# Patient Record
Sex: Male | Born: 1950 | Race: White | Hispanic: No | Marital: Married | Smoking: Never smoker
Health system: Southern US, Community
[De-identification: ages and names within clinical notes are randomized; demographics above are authoritative.]

## PROBLEM LIST (undated history)

## (undated) DIAGNOSIS — Z85828 Personal history of other malignant neoplasm of skin: Secondary | ICD-10-CM

## (undated) DIAGNOSIS — R7303 Prediabetes: Secondary | ICD-10-CM

## (undated) DIAGNOSIS — E785 Hyperlipidemia, unspecified: Secondary | ICD-10-CM

## (undated) DIAGNOSIS — K635 Polyp of colon: Secondary | ICD-10-CM

## (undated) DIAGNOSIS — H269 Unspecified cataract: Secondary | ICD-10-CM

## (undated) HISTORY — DX: Unspecified cataract: H26.9

## (undated) HISTORY — DX: Personal history of other malignant neoplasm of skin: Z85.828

## (undated) HISTORY — PX: COLONOSCOPY: SHX174

## (undated) HISTORY — DX: Polyp of colon: K63.5

## (undated) HISTORY — PX: APPENDECTOMY: SHX54

## (undated) HISTORY — DX: Hyperlipidemia, unspecified: E78.5

## (undated) HISTORY — PX: COSMETIC SURGERY: SHX468

## (undated) HISTORY — DX: Prediabetes: R73.03

---
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
## 2004-01-24 ENCOUNTER — Ambulatory Visit (HOSPITAL_COMMUNITY): Admission: RE | Admit: 2004-01-24 | Discharge: 2004-01-24 | Payer: Self-pay | Admitting: Neurology

## 2010-12-26 HISTORY — PX: OTHER SURGICAL HISTORY: SHX169

## 2015-02-13 ENCOUNTER — Institutional Professional Consult (permissible substitution): Payer: Self-pay | Admitting: Internal Medicine

## 2015-02-25 ENCOUNTER — Emergency Department (HOSPITAL_BASED_OUTPATIENT_CLINIC_OR_DEPARTMENT_OTHER)
Admission: EM | Admit: 2015-02-25 | Discharge: 2015-02-25 | Disposition: A | Discharge status: Home / Self Care | Attending: Emergency Medicine | Admitting: Emergency Medicine

## 2015-02-25 ENCOUNTER — Emergency Department (HOSPITAL_BASED_OUTPATIENT_CLINIC_OR_DEPARTMENT_OTHER)

## 2015-02-25 ENCOUNTER — Encounter (HOSPITAL_BASED_OUTPATIENT_CLINIC_OR_DEPARTMENT_OTHER): Payer: Self-pay | Admitting: Emergency Medicine

## 2015-02-25 DIAGNOSIS — X58XXXA Exposure to other specified factors, initial encounter: Secondary | ICD-10-CM | POA: Insufficient documentation

## 2015-02-25 DIAGNOSIS — R52 Pain, unspecified: Secondary | ICD-10-CM

## 2015-02-25 DIAGNOSIS — Y9389 Activity, other specified: Secondary | ICD-10-CM | POA: Diagnosis not present

## 2015-02-25 DIAGNOSIS — Z880 Allergy status to penicillin: Secondary | ICD-10-CM | POA: Diagnosis not present

## 2015-02-25 DIAGNOSIS — Y998 Other external cause status: Secondary | ICD-10-CM | POA: Insufficient documentation

## 2015-02-25 DIAGNOSIS — S63501A Unspecified sprain of right wrist, initial encounter: Secondary | ICD-10-CM

## 2015-02-25 DIAGNOSIS — Y9289 Other specified places as the place of occurrence of the external cause: Secondary | ICD-10-CM | POA: Diagnosis not present

## 2015-02-25 DIAGNOSIS — S6991XA Unspecified injury of right wrist, hand and finger(s), initial encounter: Secondary | ICD-10-CM | POA: Diagnosis present

## 2015-02-25 MED ORDER — NAPROXEN 250 MG PO TABS
500.0000 mg | ORAL_TABLET | Freq: Once | ORAL | Status: AC
  Administered 2015-02-25: 500 mg via ORAL
  Filled 2015-02-25: qty 2

## 2015-02-25 MED ORDER — MELOXICAM 15 MG PO TABS: 15.0000 mg | ORAL_TABLET | Freq: Every day | ORAL | Status: DC

## 2015-02-25 MED ORDER — TRAMADOL HCL 50 MG PO TABS: 50.0000 mg | ORAL_TABLET | Freq: Four times a day (QID) | ORAL | Status: DC | PRN

## 2015-02-25 NOTE — ED Provider Notes (Signed)
CSN: [id number]     Arrival date & time 02/25/15  1952 History   First MD Initiated Contact with Patient 02/25/15 2117     Chief Complaint  Patient presents with  . Wrist Pain     (Consider location/radiation/quality/duration/timing/severity/associated sxs/prior Treatment) HPI Victor Zamora is a 64 y.o. male presents to emergency department after right wrist injury. Patient states he was lifting a heavy bag of dirt with one hand when his hand bent backwards at the wrist joint. Says the pain to the anterior wrist. Tender to palpation and painful with movement. Nothing makes it better. He did not take any medications. States initially after the injuries hand felt tingly and numb, however that resolved. He denies any prior wrist or hand issues. Pain radiates to the elbow and the hand. Pain is rated as 6 out of 10  History reviewed. No pertinent past medical history. History reviewed. No pertinent past surgical history. History reviewed. No pertinent family history. History  Substance Use Topics  . Smoking status: Never Smoker   . Smokeless tobacco: Never Used  . Alcohol Use: Yes    Review of Systems  Constitutional: Negative for fever and chills.  Musculoskeletal: Positive for joint swelling and arthralgias.  Neurological: Negative for weakness and numbness.      Allergies  Penicillins  Home Medications   Prior to Admission medications   Not on File   BP 126/89 mmHg  Pulse 105  Temp(Src) 97.9 F (36.6 C) (Oral)  Resp 20  Ht 5\' 11"  (1.803 m)  Wt 133 lb (60.328 kg)  BMI 18.56 kg/m2  SpO2 100% Physical Exam  Constitutional: He appears well-developed and well-nourished.  HENT:  Head: Normocephalic.  Eyes: Conjunctivae are normal.  Neck: Neck supple.  Cardiovascular: Normal rate and regular rhythm.   Pulmonary/Chest: Effort normal and breath sounds normal. No respiratory distress. He has no wheezes. He has no rales.  Musculoskeletal:  No obvious swelling noted to the  right wrist. Tender to palpation over the flexor tendons of the wrist. Pain with any flexion or extension of the wrist joint. Normal hand. Pain with flexion of all the fingers against resistance. Patient is able to oppose his thumb to ring finger. Able to do thumbs up. Able to spread the fingers. Normal elbow exam. Strength is intact. Cap refill less than 2 seconds distally. Sensation is intact of her palmar and dorsal surface of the hand.  Skin: Skin is warm and dry.  Nursing note and vitals reviewed.   ED Course  Procedures (including critical care time) Labs Review Labs Reviewed - No data to display  Imaging Review Dg Wrist Complete Right  02/25/2015   CLINICAL DATA:  Right wrist pain, onset after lifting heavy object.  EXAM: RIGHT WRIST - COMPLETE 3+ VIEW  COMPARISON:  None.  FINDINGS: There is no evidence of fracture or dislocation. There is no evidence of arthropathy or other focal bone abnormality. Soft tissues are unremarkable.  IMPRESSION: Negative.   Electronically Signed   By: Andreas Newport M.D.   On: 02/25/2015 21:50     EKG Interpretation None      MDM   Final diagnoses:  Wrist sprain, right, initial encounter    patient with wrist injury, x-ray negative. Most likely tendon flexor strain. Doubt any tear, strength is intact. We'll place a Velcro wrist splint. Home with NSAIDs and follow-up with hand specialist.  Filed Vitals:   02/25/15 2037 02/25/15 2245  BP: 126/89 132/81  Pulse: 105 86  Temp: 97.9 F (36.6 C)   TempSrc: Oral   Resp: 20 18  Height: 5\' 11"  (1.803 m)   Weight: 133 lb (60.328 kg)   SpO2: 100% 99%     Jeannett Senior, PA-C 02/25/15 [place], MD 02/26/15 [id number]

## 2015-02-25 NOTE — ED Notes (Addendum)
Pt report onset of right wrist pain after lifting heavy bag of dirt. Pain increases with movement

## 2015-02-25 NOTE — Discharge Instructions (Signed)
Low back and Ultram for pain. Keep the splint on at all times. Ice, elevate. No lifting greater than 1 lb until cleared by hand specialist.   Wrist Pain Wrist injuries are frequent in adults and children. A sprain is an injury to the ligaments that hold your bones together. A strain is an injury to muscle or muscle cord-like structures (tendons) from stretching or pulling. Generally, when wrists are moderately tender to touch following a fall or injury, a break in the bone (fracture) may be present. Most wrist sprains or strains are better in 3 to 5 days, but complete healing may take several weeks. HOME CARE INSTRUCTIONS   Put ice on the injured area.  Put ice in a plastic bag.  Place a towel between your skin and the bag.  Leave the ice on for 15-20 minutes, 3-4 times a day, for the first 2 days, or as directed by your health care provider.  Keep your arm raised above the level of your heart whenever possible to reduce swelling and pain.  Rest the injured area for at least 48 hours or as directed by your health care provider.  If a splint or elastic bandage has been applied, use it for as long as directed by your health care provider or until seen by a health care provider for a follow-up exam.  Only take over-the-counter or prescription medicines for pain, discomfort, or fever as directed by your health care provider.  Keep all follow-up appointments. You may need to follow up with a specialist or have follow-up X-rays. Improvement in pain level is not a guarantee that you did not fracture a bone in your wrist. The only way to determine whether or not you have a broken bone is by X-ray. SEEK IMMEDIATE MEDICAL CARE IF:   Your fingers are swollen, very red, white, or cold and blue.  Your fingers are numb or tingling.  You have increasing pain.  You have difficulty moving your fingers. MAKE SURE YOU:   Understand these instructions.  Will watch your condition.  Will get help  right away if you are not doing well or get worse. Document Released: 08/21/2005 Document Revised: 11/16/2013 Document Reviewed: 01/02/2011 [facility] Patient Information 2015 [place], [place]. This information is not intended to replace advice given to you by your health care provider. Make sure you discuss any questions you have with your health care provider.

## 2016-09-09 IMAGING — CR DG WRIST COMPLETE 3+V*R*
4 series · 4 of 4 positions shown · non-contrast
Comparison: None.

CLINICAL DATA: Right wrist pain, onset after lifting heavy object.

EXAM:
RIGHT WRIST - COMPLETE 3+ VIEW

[x navicular]
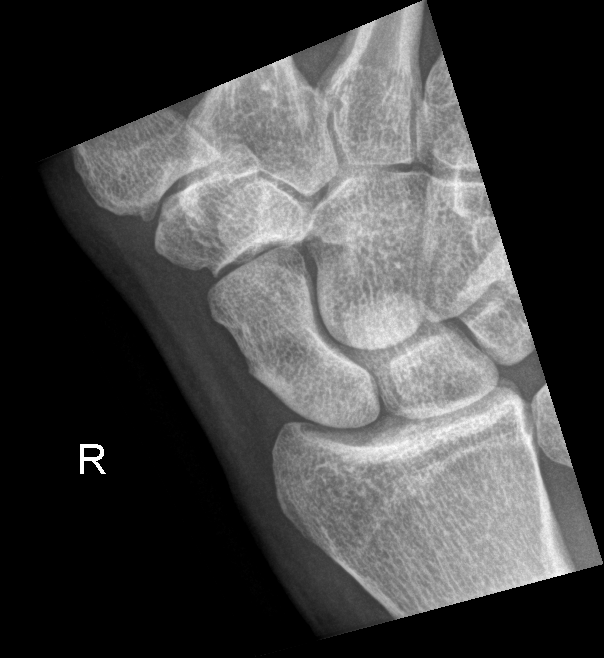

[x wrist pa left]
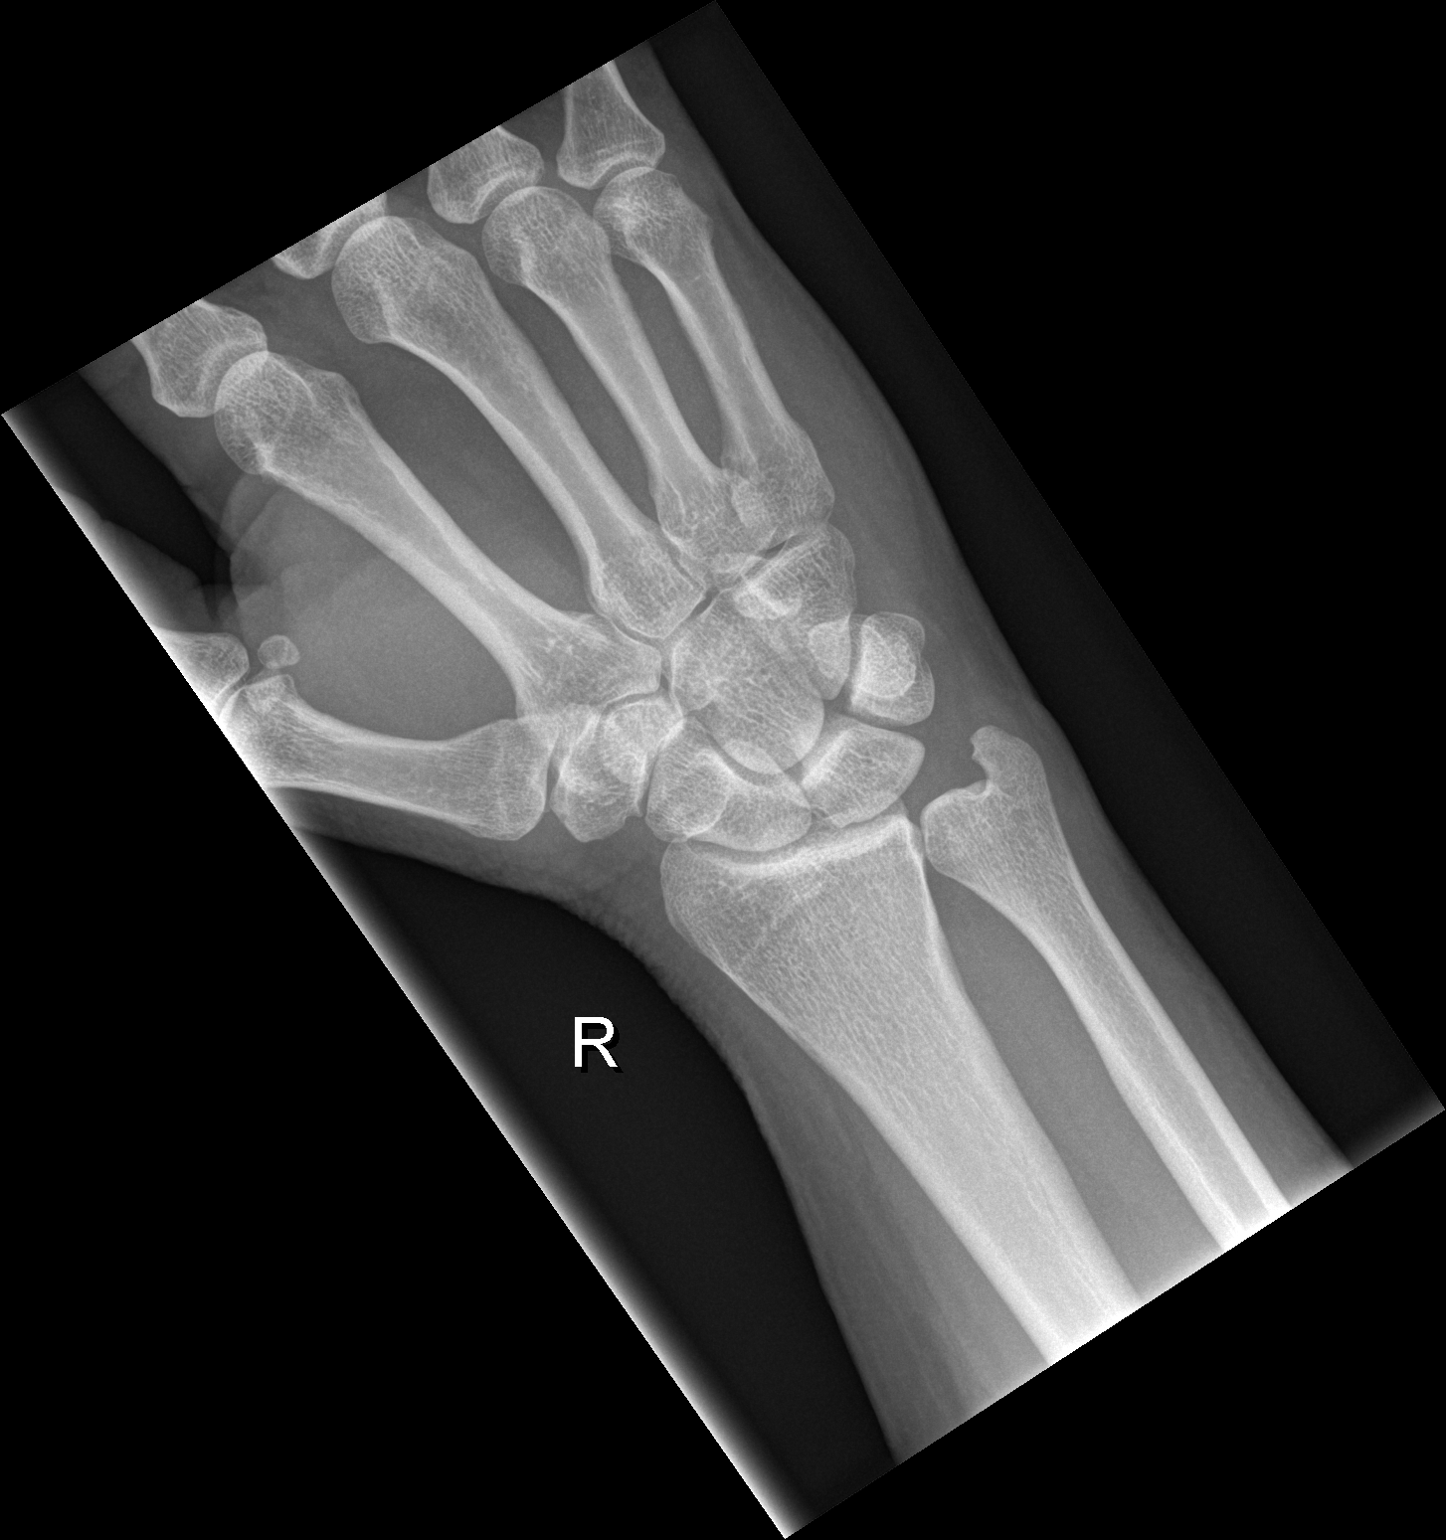

[x wrist obl left]
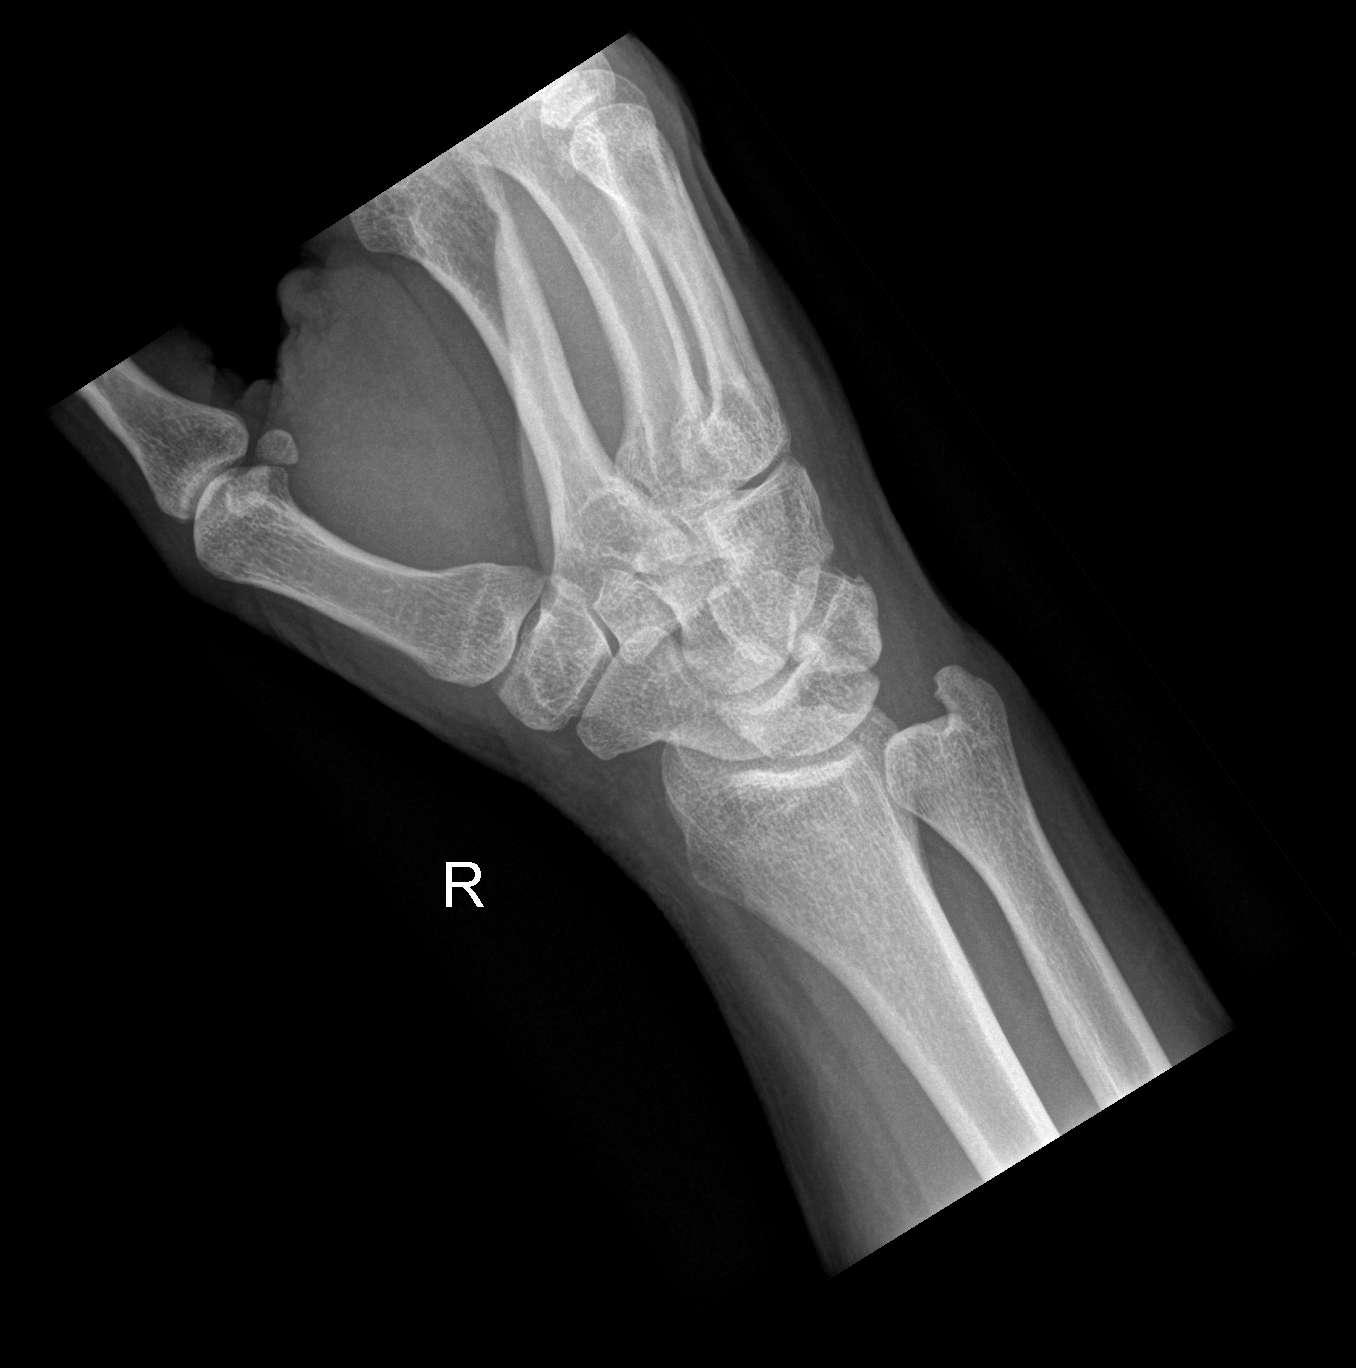

[x wrist lat left]
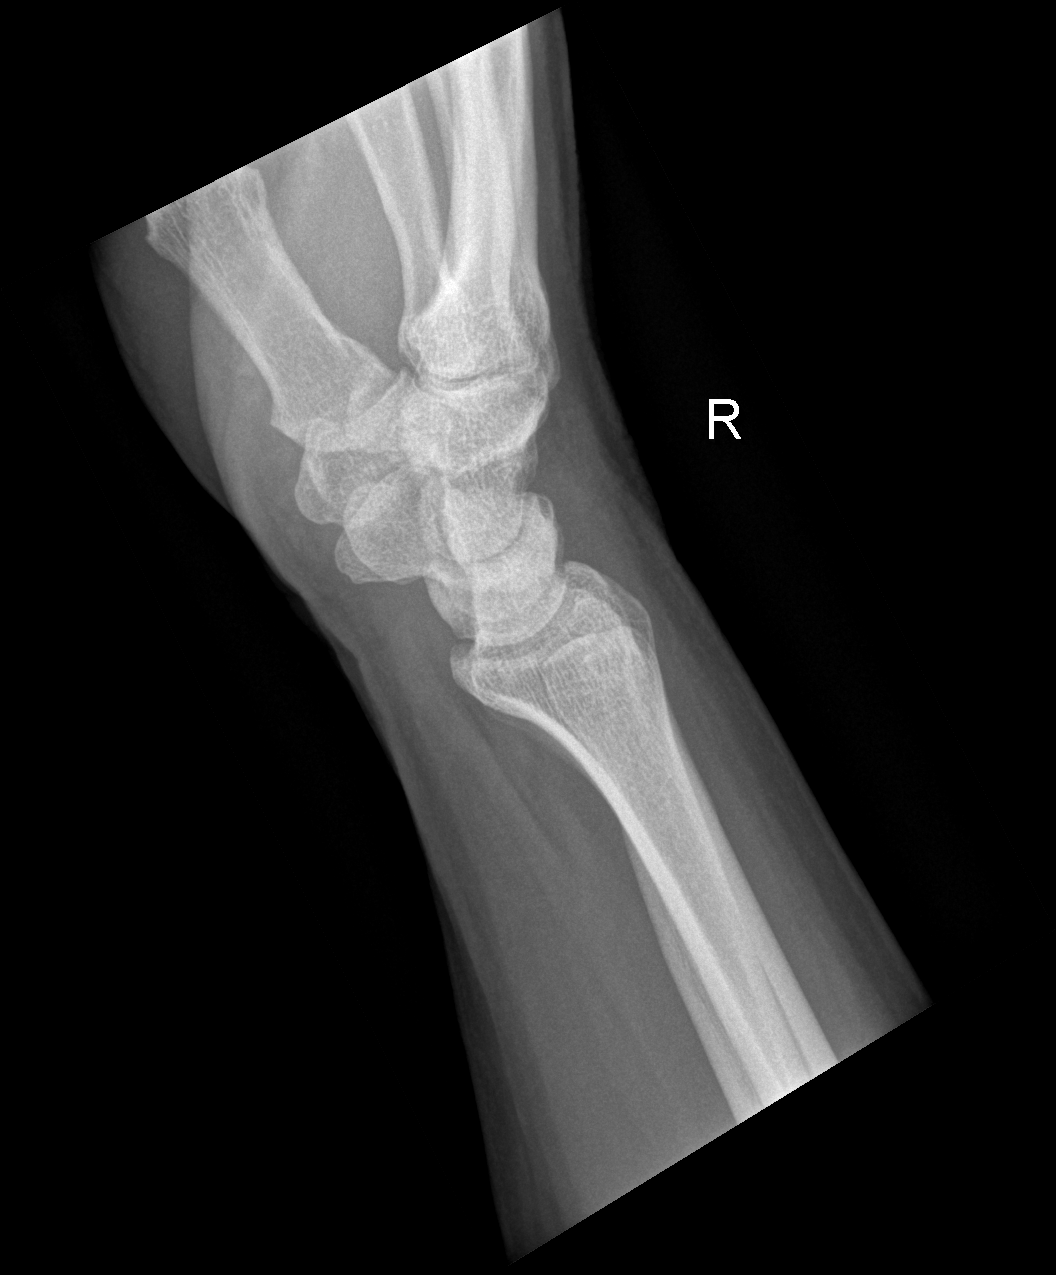

[4 of 4 positions shown; findings below may reference images not displayed]

FINDINGS: There is no evidence of fracture or dislocation. There is no
evidence of arthropathy or other focal bone abnormality. Soft
tissues are unremarkable.
IMPRESSION: Negative.

## 2017-03-26 ENCOUNTER — Encounter: Payer: Self-pay | Admitting: Podiatry

## 2017-03-26 ENCOUNTER — Ambulatory Visit (INDEPENDENT_AMBULATORY_CARE_PROVIDER_SITE_OTHER): Admitting: Podiatry

## 2017-03-26 VITALS — BP 136/88 | HR 77 | Ht 71.0 in | Wt 230.0 lb

## 2017-03-26 DIAGNOSIS — M79672 Pain in left foot: Secondary | ICD-10-CM | POA: Diagnosis not present

## 2017-03-26 DIAGNOSIS — M216X2 Other acquired deformities of left foot: Secondary | ICD-10-CM

## 2017-03-26 DIAGNOSIS — M21962 Unspecified acquired deformity of left lower leg: Secondary | ICD-10-CM

## 2017-03-26 DIAGNOSIS — G5762 Lesion of plantar nerve, left lower limb: Secondary | ICD-10-CM | POA: Diagnosis not present

## 2017-03-26 NOTE — Patient Instructions (Signed)
Seen for pain under the ball of left foot. Noted of compensatory hyperpronation of foot and ankle in high arch foot. Reviewed findings and available treatment options. Metatarsal binder x 1 pair dispensed. Placed in Ankle brace. Return for follow up. May start on custom orthotics.

## 2017-03-26 NOTE — Progress Notes (Signed)
SUBJECTIVE: 66 y.o. year old male presents complaining of pain under the ball of left foot for 4-5 months. Been treated with injection, change shoes, OTC orthotics. Last injection helped for a day. 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 At times goes out to walk. Been in [place] that required to be on feet long hours. Always had high arches. Patient was referred by Dr. Leonette Nutting.   HPI: Pain started from arch and heel pain 4-5 months ago and now pain is at the ball of left foot. Been to [facility] and was told high arch is causing problem. Patient was treated conservatively by Dr. Gershon Mussel prior to this visit.   REVIEW OF SYSTEMS: A comprehensive review of systems was negative except for: bursitis in left hip.  OBJECTIVE: DERMATOLOGIC EXAMINATION: No abnormal skin lesions noted.  VASCULAR EXAMINATION OF LOWER LIMBS: All pedal pulses are palpable with normal pulsation.  Capillary Filling times within 3 seconds in all digits.  No edema or erythema noted. Temperature gradient from tibial crest to dorsum of foot is within normal bilateral.  NEUROLOGIC EXAMINATION OF THE LOWER LIMBS: All epicritic and tactile sensations grossly intact. Sharp and Dull discriminatory sensations at the plantar ball of hallux is intact bilateral.   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 Compensatory ankle joint pronation L>R. Increased sagittal plane motion of the first ray left. Pain with weight bearing under the 2nd intermetatarsal space left foot duration of 4-5 months.  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 Compensated rearfoot varus left. Excess first ray motion with abnormal lateral weight shifting left.  PLAN: Reviewed findings and available treatment options, injection, orthotics, proper shoe gear, and surgical. Metatarsal binder dispensed to add stability of the first ray left. Ankle brace  dispensed to limit ankle pronation left. Patient is to bring his orthotics for evaluation. Return in 2 weeks for possible custom orthotics and possible pre op discussion if ankle brace fail to alleviate left foot pain.

## 2017-04-10 ENCOUNTER — Ambulatory Visit: Admitting: Podiatry

## 2017-04-14 ENCOUNTER — Ambulatory Visit (INDEPENDENT_AMBULATORY_CARE_PROVIDER_SITE_OTHER): Admitting: Podiatry

## 2017-04-14 DIAGNOSIS — G5762 Lesion of plantar nerve, left lower limb: Secondary | ICD-10-CM

## 2017-04-14 DIAGNOSIS — M21962 Unspecified acquired deformity of left lower leg: Secondary | ICD-10-CM

## 2017-04-14 DIAGNOSIS — M216X2 Other acquired deformities of left foot: Secondary | ICD-10-CM

## 2017-04-14 NOTE — Progress Notes (Signed)
SUBJECTIVE: 66 y.o. year old male presents stating that the foot is doing better except with brace. Brace is making the foot hurt. But the band is helping to walker better. Pain used to be over level 10. Now the pain level is in between 3-4.  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 At times goes out to walk. Been in [place] that required to be on feet long hours. Always had high arches. Patient was referred by Dr. Leonette Nutting.   HPI: Pain started from arch and heel pain 4-5 months ago and now pain is at the ball of left foot. Been to [facility] and was told high arch is causing problem. Patient was treated conservatively by Dr. Gershon Mussel prior to this visit.   REVIEW OF SYSTEMS: A comprehensive review of systems was negative except for: bursitis in left hip.  OBJECTIVE: DERMATOLOGIC EXAMINATION: No abnormal skin lesions noted.  VASCULAR EXAMINATION OF LOWER LIMBS: All pedal pulses are palpable with normal pulsation.  Capillary Filling times within 3 seconds in all digits.  No edema or erythema noted. Temperature gradient from tibial crest to dorsum of foot is within normal bilateral.  NEUROLOGIC EXAMINATION OF THE LOWER LIMBS: All epicritic and tactile sensations grossly intact. Sharp and Dull discriminatory sensations at the plantar ball of hallux is intact bilateral.   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 Compensatory ankle joint pronation L>R. Increased sagittal plane motion of the first ray left. Pain with weight bearing under the 2nd intermetatarsal space left foot duration of 4-5 months.  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 Compensated rearfoot varus left. Excess first ray motion with abnormal lateral weight shifting left.  PLAN: Reviewed findings and available treatment options, injection, orthotics, proper shoe gear, and surgical. Continue with metatarsal  binder as needed to add stability of the first ray left. Reviewed benefit of custom orthotics. Return as needed.

## 2017-04-14 NOTE — Patient Instructions (Signed)
Improving pain under the ball of left foot. Continue with metatarsal binder and padding. May benefit from custom orthotics. Return as needed.

## 2017-04-15 ENCOUNTER — Encounter: Payer: Self-pay | Admitting: Podiatry

## 2017-11-25 HISTORY — PX: OTHER SURGICAL HISTORY: SHX169

## 2017-12-19 DIAGNOSIS — L570 Actinic keratosis: Secondary | ICD-10-CM | POA: Diagnosis not present

## 2018-01-14 DIAGNOSIS — D485 Neoplasm of uncertain behavior of skin: Secondary | ICD-10-CM | POA: Diagnosis not present

## 2018-01-14 DIAGNOSIS — L578 Other skin changes due to chronic exposure to nonionizing radiation: Secondary | ICD-10-CM | POA: Diagnosis not present

## 2018-01-14 DIAGNOSIS — C4442 Squamous cell carcinoma of skin of scalp and neck: Secondary | ICD-10-CM | POA: Diagnosis not present

## 2018-01-14 DIAGNOSIS — L723 Sebaceous cyst: Secondary | ICD-10-CM | POA: Diagnosis not present

## 2018-01-15 DIAGNOSIS — L570 Actinic keratosis: Secondary | ICD-10-CM | POA: Diagnosis not present

## 2018-01-26 DIAGNOSIS — L905 Scar conditions and fibrosis of skin: Secondary | ICD-10-CM | POA: Diagnosis not present

## 2018-01-26 DIAGNOSIS — C4442 Squamous cell carcinoma of skin of scalp and neck: Secondary | ICD-10-CM | POA: Diagnosis not present

## 2018-02-16 DIAGNOSIS — Z136 Encounter for screening for cardiovascular disorders: Secondary | ICD-10-CM | POA: Diagnosis not present

## 2018-02-16 DIAGNOSIS — R7301 Impaired fasting glucose: Secondary | ICD-10-CM | POA: Diagnosis not present

## 2018-02-16 DIAGNOSIS — Z135 Encounter for screening for eye and ear disorders: Secondary | ICD-10-CM | POA: Diagnosis not present

## 2018-03-02 DIAGNOSIS — L570 Actinic keratosis: Secondary | ICD-10-CM | POA: Diagnosis not present

## 2018-04-23 DIAGNOSIS — L570 Actinic keratosis: Secondary | ICD-10-CM | POA: Diagnosis not present

## 2018-05-26 DIAGNOSIS — L720 Epidermal cyst: Secondary | ICD-10-CM | POA: Diagnosis not present

## 2018-05-26 DIAGNOSIS — L723 Sebaceous cyst: Secondary | ICD-10-CM | POA: Diagnosis not present

## 2018-05-26 DIAGNOSIS — L821 Other seborrheic keratosis: Secondary | ICD-10-CM | POA: Diagnosis not present

## 2018-06-05 DIAGNOSIS — H35363 Drusen (degenerative) of macula, bilateral: Secondary | ICD-10-CM | POA: Diagnosis not present

## 2018-06-05 DIAGNOSIS — H35033 Hypertensive retinopathy, bilateral: Secondary | ICD-10-CM | POA: Diagnosis not present

## 2018-06-05 DIAGNOSIS — H40013 Open angle with borderline findings, low risk, bilateral: Secondary | ICD-10-CM | POA: Diagnosis not present

## 2018-06-05 DIAGNOSIS — H43812 Vitreous degeneration, left eye: Secondary | ICD-10-CM | POA: Diagnosis not present

## 2018-07-28 DIAGNOSIS — H02413 Mechanical ptosis of bilateral eyelids: Secondary | ICD-10-CM | POA: Diagnosis not present

## 2018-08-17 DIAGNOSIS — H02412 Mechanical ptosis of left eyelid: Secondary | ICD-10-CM | POA: Diagnosis not present

## 2018-08-17 DIAGNOSIS — H02413 Mechanical ptosis of bilateral eyelids: Secondary | ICD-10-CM | POA: Diagnosis not present

## 2018-08-17 DIAGNOSIS — H02411 Mechanical ptosis of right eyelid: Secondary | ICD-10-CM | POA: Diagnosis not present

## 2018-09-24 DIAGNOSIS — L570 Actinic keratosis: Secondary | ICD-10-CM | POA: Diagnosis not present

## 2018-11-04 DIAGNOSIS — M7541 Impingement syndrome of right shoulder: Secondary | ICD-10-CM | POA: Diagnosis not present

## 2018-11-04 DIAGNOSIS — M25511 Pain in right shoulder: Secondary | ICD-10-CM | POA: Diagnosis not present

## 2018-11-27 DIAGNOSIS — H02833 Dermatochalasis of right eye, unspecified eyelid: Secondary | ICD-10-CM | POA: Diagnosis not present

## 2018-11-27 DIAGNOSIS — H40013 Open angle with borderline findings, low risk, bilateral: Secondary | ICD-10-CM | POA: Diagnosis not present

## 2018-11-27 DIAGNOSIS — H02403 Unspecified ptosis of bilateral eyelids: Secondary | ICD-10-CM | POA: Diagnosis not present

## 2019-02-12 DIAGNOSIS — L570 Actinic keratosis: Secondary | ICD-10-CM | POA: Diagnosis not present

## 2019-02-12 DIAGNOSIS — Z85828 Personal history of other malignant neoplasm of skin: Secondary | ICD-10-CM | POA: Diagnosis not present

## 2019-02-12 DIAGNOSIS — L819 Disorder of pigmentation, unspecified: Secondary | ICD-10-CM | POA: Diagnosis not present

## 2019-02-12 DIAGNOSIS — D485 Neoplasm of uncertain behavior of skin: Secondary | ICD-10-CM | POA: Diagnosis not present

## 2019-02-12 DIAGNOSIS — L820 Inflamed seborrheic keratosis: Secondary | ICD-10-CM | POA: Diagnosis not present

## 2019-05-03 DIAGNOSIS — Z03818 Encounter for observation for suspected exposure to other biological agents ruled out: Secondary | ICD-10-CM | POA: Diagnosis not present

## 2019-06-07 DIAGNOSIS — H40013 Open angle with borderline findings, low risk, bilateral: Secondary | ICD-10-CM | POA: Diagnosis not present

## 2019-06-07 DIAGNOSIS — Z961 Presence of intraocular lens: Secondary | ICD-10-CM | POA: Diagnosis not present

## 2019-06-07 DIAGNOSIS — H35033 Hypertensive retinopathy, bilateral: Secondary | ICD-10-CM | POA: Diagnosis not present

## 2019-06-07 DIAGNOSIS — H43812 Vitreous degeneration, left eye: Secondary | ICD-10-CM | POA: Diagnosis not present

## 2019-06-22 DIAGNOSIS — C44529 Squamous cell carcinoma of skin of other part of trunk: Secondary | ICD-10-CM | POA: Diagnosis not present

## 2019-06-22 DIAGNOSIS — D485 Neoplasm of uncertain behavior of skin: Secondary | ICD-10-CM | POA: Diagnosis not present

## 2019-07-05 DIAGNOSIS — L988 Other specified disorders of the skin and subcutaneous tissue: Secondary | ICD-10-CM | POA: Diagnosis not present

## 2019-07-05 DIAGNOSIS — Z85828 Personal history of other malignant neoplasm of skin: Secondary | ICD-10-CM | POA: Diagnosis not present

## 2019-07-05 DIAGNOSIS — C44529 Squamous cell carcinoma of skin of other part of trunk: Secondary | ICD-10-CM | POA: Diagnosis not present

## 2019-08-05 DIAGNOSIS — L570 Actinic keratosis: Secondary | ICD-10-CM | POA: Diagnosis not present

## 2019-08-05 DIAGNOSIS — D485 Neoplasm of uncertain behavior of skin: Secondary | ICD-10-CM | POA: Diagnosis not present

## 2019-08-05 DIAGNOSIS — Z85828 Personal history of other malignant neoplasm of skin: Secondary | ICD-10-CM | POA: Diagnosis not present

## 2019-12-29 DIAGNOSIS — R69 Illness, unspecified: Secondary | ICD-10-CM | POA: Diagnosis not present

## 2019-12-30 DIAGNOSIS — N5201 Erectile dysfunction due to arterial insufficiency: Secondary | ICD-10-CM | POA: Diagnosis not present

## 2019-12-30 DIAGNOSIS — E349 Endocrine disorder, unspecified: Secondary | ICD-10-CM | POA: Diagnosis not present

## 2020-02-23 DIAGNOSIS — N5201 Erectile dysfunction due to arterial insufficiency: Secondary | ICD-10-CM | POA: Diagnosis not present

## 2020-03-24 DIAGNOSIS — H43811 Vitreous degeneration, right eye: Secondary | ICD-10-CM | POA: Diagnosis not present

## 2020-03-24 DIAGNOSIS — H43393 Other vitreous opacities, bilateral: Secondary | ICD-10-CM | POA: Diagnosis not present

## 2020-05-10 DIAGNOSIS — D2361 Other benign neoplasm of skin of right upper limb, including shoulder: Secondary | ICD-10-CM | POA: Diagnosis not present

## 2020-05-10 DIAGNOSIS — D1801 Hemangioma of skin and subcutaneous tissue: Secondary | ICD-10-CM | POA: Diagnosis not present

## 2020-05-10 DIAGNOSIS — L820 Inflamed seborrheic keratosis: Secondary | ICD-10-CM | POA: Diagnosis not present

## 2020-05-10 DIAGNOSIS — L570 Actinic keratosis: Secondary | ICD-10-CM | POA: Diagnosis not present

## 2020-05-10 DIAGNOSIS — Z85828 Personal history of other malignant neoplasm of skin: Secondary | ICD-10-CM | POA: Diagnosis not present

## 2020-05-10 DIAGNOSIS — D0439 Carcinoma in situ of skin of other parts of face: Secondary | ICD-10-CM | POA: Diagnosis not present

## 2020-05-10 DIAGNOSIS — L821 Other seborrheic keratosis: Secondary | ICD-10-CM | POA: Diagnosis not present

## 2020-05-10 DIAGNOSIS — D485 Neoplasm of uncertain behavior of skin: Secondary | ICD-10-CM | POA: Diagnosis not present

## 2020-06-28 DIAGNOSIS — R69 Illness, unspecified: Secondary | ICD-10-CM | POA: Diagnosis not present

## 2020-07-03 DIAGNOSIS — Z1152 Encounter for screening for COVID-19: Secondary | ICD-10-CM | POA: Diagnosis not present

## 2020-07-03 DIAGNOSIS — Z20828 Contact with and (suspected) exposure to other viral communicable diseases: Secondary | ICD-10-CM | POA: Diagnosis not present

## 2020-07-20 DIAGNOSIS — Z85828 Personal history of other malignant neoplasm of skin: Secondary | ICD-10-CM | POA: Diagnosis not present

## 2020-07-20 DIAGNOSIS — M71341 Other bursal cyst, right hand: Secondary | ICD-10-CM | POA: Diagnosis not present

## 2020-07-20 DIAGNOSIS — D485 Neoplasm of uncertain behavior of skin: Secondary | ICD-10-CM | POA: Diagnosis not present

## 2020-08-02 DIAGNOSIS — N529 Male erectile dysfunction, unspecified: Secondary | ICD-10-CM | POA: Diagnosis not present

## 2020-08-02 DIAGNOSIS — E785 Hyperlipidemia, unspecified: Secondary | ICD-10-CM | POA: Diagnosis not present

## 2020-08-02 DIAGNOSIS — Z0000 Encounter for general adult medical examination without abnormal findings: Secondary | ICD-10-CM | POA: Diagnosis not present

## 2020-08-02 DIAGNOSIS — R739 Hyperglycemia, unspecified: Secondary | ICD-10-CM | POA: Diagnosis not present

## 2020-08-02 DIAGNOSIS — Z125 Encounter for screening for malignant neoplasm of prostate: Secondary | ICD-10-CM | POA: Diagnosis not present

## 2020-08-21 DIAGNOSIS — R69 Illness, unspecified: Secondary | ICD-10-CM | POA: Diagnosis not present

## 2021-02-26 DIAGNOSIS — H524 Presbyopia: Secondary | ICD-10-CM | POA: Diagnosis not present

## 2021-02-26 DIAGNOSIS — H43812 Vitreous degeneration, left eye: Secondary | ICD-10-CM | POA: Diagnosis not present

## 2021-02-26 DIAGNOSIS — H43393 Other vitreous opacities, bilateral: Secondary | ICD-10-CM | POA: Diagnosis not present

## 2021-02-26 DIAGNOSIS — H43811 Vitreous degeneration, right eye: Secondary | ICD-10-CM | POA: Diagnosis not present

## 2021-02-26 DIAGNOSIS — H35363 Drusen (degenerative) of macula, bilateral: Secondary | ICD-10-CM | POA: Diagnosis not present

## 2021-04-10 DIAGNOSIS — L820 Inflamed seborrheic keratosis: Secondary | ICD-10-CM | POA: Diagnosis not present

## 2021-04-10 DIAGNOSIS — Z85828 Personal history of other malignant neoplasm of skin: Secondary | ICD-10-CM | POA: Diagnosis not present

## 2021-04-10 DIAGNOSIS — L821 Other seborrheic keratosis: Secondary | ICD-10-CM | POA: Diagnosis not present

## 2021-04-10 DIAGNOSIS — L720 Epidermal cyst: Secondary | ICD-10-CM | POA: Diagnosis not present

## 2021-04-10 DIAGNOSIS — D485 Neoplasm of uncertain behavior of skin: Secondary | ICD-10-CM | POA: Diagnosis not present

## 2021-04-10 DIAGNOSIS — L814 Other melanin hyperpigmentation: Secondary | ICD-10-CM | POA: Diagnosis not present

## 2021-04-10 DIAGNOSIS — D225 Melanocytic nevi of trunk: Secondary | ICD-10-CM | POA: Diagnosis not present

## 2021-04-10 DIAGNOSIS — L570 Actinic keratosis: Secondary | ICD-10-CM | POA: Diagnosis not present

## 2021-05-14 DIAGNOSIS — L988 Other specified disorders of the skin and subcutaneous tissue: Secondary | ICD-10-CM | POA: Diagnosis not present

## 2021-05-14 DIAGNOSIS — D485 Neoplasm of uncertain behavior of skin: Secondary | ICD-10-CM | POA: Diagnosis not present

## 2021-08-23 DIAGNOSIS — R066 Hiccough: Secondary | ICD-10-CM | POA: Diagnosis not present

## 2021-08-28 DIAGNOSIS — M546 Pain in thoracic spine: Secondary | ICD-10-CM | POA: Diagnosis not present

## 2021-09-02 DIAGNOSIS — Z20822 Contact with and (suspected) exposure to covid-19: Secondary | ICD-10-CM | POA: Diagnosis not present

## 2021-09-17 DIAGNOSIS — J209 Acute bronchitis, unspecified: Secondary | ICD-10-CM | POA: Diagnosis not present

## 2021-09-17 DIAGNOSIS — U099 Post covid-19 condition, unspecified: Secondary | ICD-10-CM | POA: Diagnosis not present

## 2021-09-17 DIAGNOSIS — R053 Chronic cough: Secondary | ICD-10-CM | POA: Diagnosis not present

## 2021-10-08 DIAGNOSIS — L570 Actinic keratosis: Secondary | ICD-10-CM | POA: Diagnosis not present

## 2021-10-08 DIAGNOSIS — D485 Neoplasm of uncertain behavior of skin: Secondary | ICD-10-CM | POA: Diagnosis not present

## 2021-10-08 DIAGNOSIS — L821 Other seborrheic keratosis: Secondary | ICD-10-CM | POA: Diagnosis not present

## 2021-10-08 DIAGNOSIS — D0439 Carcinoma in situ of skin of other parts of face: Secondary | ICD-10-CM | POA: Diagnosis not present

## 2021-12-28 DIAGNOSIS — R7301 Impaired fasting glucose: Secondary | ICD-10-CM | POA: Diagnosis not present

## 2021-12-28 DIAGNOSIS — Z125 Encounter for screening for malignant neoplasm of prostate: Secondary | ICD-10-CM | POA: Diagnosis not present

## 2021-12-28 DIAGNOSIS — N529 Male erectile dysfunction, unspecified: Secondary | ICD-10-CM | POA: Diagnosis not present

## 2021-12-28 DIAGNOSIS — E785 Hyperlipidemia, unspecified: Secondary | ICD-10-CM | POA: Diagnosis not present

## 2022-01-03 DIAGNOSIS — E785 Hyperlipidemia, unspecified: Secondary | ICD-10-CM | POA: Diagnosis not present

## 2022-01-03 DIAGNOSIS — Z0000 Encounter for general adult medical examination without abnormal findings: Secondary | ICD-10-CM | POA: Diagnosis not present

## 2022-01-03 DIAGNOSIS — N529 Male erectile dysfunction, unspecified: Secondary | ICD-10-CM | POA: Diagnosis not present

## 2022-01-03 DIAGNOSIS — R7303 Prediabetes: Secondary | ICD-10-CM | POA: Diagnosis not present

## 2022-02-13 DIAGNOSIS — D045 Carcinoma in situ of skin of trunk: Secondary | ICD-10-CM | POA: Diagnosis not present

## 2022-02-13 DIAGNOSIS — D485 Neoplasm of uncertain behavior of skin: Secondary | ICD-10-CM | POA: Diagnosis not present

## 2022-02-13 DIAGNOSIS — Z1211 Encounter for screening for malignant neoplasm of colon: Secondary | ICD-10-CM | POA: Diagnosis not present

## 2022-02-13 DIAGNOSIS — L820 Inflamed seborrheic keratosis: Secondary | ICD-10-CM | POA: Diagnosis not present

## 2022-02-28 DIAGNOSIS — H43813 Vitreous degeneration, bilateral: Secondary | ICD-10-CM | POA: Diagnosis not present

## 2022-02-28 DIAGNOSIS — H40013 Open angle with borderline findings, low risk, bilateral: Secondary | ICD-10-CM | POA: Diagnosis not present

## 2022-02-28 DIAGNOSIS — Z961 Presence of intraocular lens: Secondary | ICD-10-CM | POA: Diagnosis not present

## 2022-02-28 DIAGNOSIS — H353131 Nonexudative age-related macular degeneration, bilateral, early dry stage: Secondary | ICD-10-CM | POA: Diagnosis not present

## 2022-03-02 DIAGNOSIS — Z809 Family history of malignant neoplasm, unspecified: Secondary | ICD-10-CM | POA: Diagnosis not present

## 2022-03-02 DIAGNOSIS — Z6831 Body mass index (BMI) 31.0-31.9, adult: Secondary | ICD-10-CM | POA: Diagnosis not present

## 2022-03-02 DIAGNOSIS — N529 Male erectile dysfunction, unspecified: Secondary | ICD-10-CM | POA: Diagnosis not present

## 2022-03-02 DIAGNOSIS — E669 Obesity, unspecified: Secondary | ICD-10-CM | POA: Diagnosis not present

## 2022-03-02 DIAGNOSIS — E785 Hyperlipidemia, unspecified: Secondary | ICD-10-CM | POA: Diagnosis not present

## 2022-03-02 DIAGNOSIS — Z7722 Contact with and (suspected) exposure to environmental tobacco smoke (acute) (chronic): Secondary | ICD-10-CM | POA: Diagnosis not present

## 2022-03-02 DIAGNOSIS — Z7982 Long term (current) use of aspirin: Secondary | ICD-10-CM | POA: Diagnosis not present

## 2022-03-02 DIAGNOSIS — Z85828 Personal history of other malignant neoplasm of skin: Secondary | ICD-10-CM | POA: Diagnosis not present

## 2022-03-02 DIAGNOSIS — Z880 Allergy status to penicillin: Secondary | ICD-10-CM | POA: Diagnosis not present

## 2022-05-15 DIAGNOSIS — L570 Actinic keratosis: Secondary | ICD-10-CM | POA: Diagnosis not present

## 2022-05-15 DIAGNOSIS — Z85828 Personal history of other malignant neoplasm of skin: Secondary | ICD-10-CM | POA: Diagnosis not present

## 2022-05-15 DIAGNOSIS — L821 Other seborrheic keratosis: Secondary | ICD-10-CM | POA: Diagnosis not present

## 2022-05-15 DIAGNOSIS — D485 Neoplasm of uncertain behavior of skin: Secondary | ICD-10-CM | POA: Diagnosis not present

## 2022-05-15 DIAGNOSIS — C44519 Basal cell carcinoma of skin of other part of trunk: Secondary | ICD-10-CM | POA: Diagnosis not present

## 2022-05-15 DIAGNOSIS — D2361 Other benign neoplasm of skin of right upper limb, including shoulder: Secondary | ICD-10-CM | POA: Diagnosis not present

## 2022-07-09 DIAGNOSIS — R059 Cough, unspecified: Secondary | ICD-10-CM | POA: Diagnosis not present

## 2022-07-09 DIAGNOSIS — B349 Viral infection, unspecified: Secondary | ICD-10-CM | POA: Diagnosis not present

## 2022-07-09 DIAGNOSIS — J029 Acute pharyngitis, unspecified: Secondary | ICD-10-CM | POA: Diagnosis not present

## 2022-07-09 DIAGNOSIS — U071 COVID-19: Secondary | ICD-10-CM | POA: Diagnosis not present

## 2022-07-09 DIAGNOSIS — R0982 Postnasal drip: Secondary | ICD-10-CM | POA: Diagnosis not present

## 2022-07-11 DIAGNOSIS — Z20822 Contact with and (suspected) exposure to covid-19: Secondary | ICD-10-CM | POA: Diagnosis not present

## 2022-07-12 DIAGNOSIS — Z20822 Contact with and (suspected) exposure to covid-19: Secondary | ICD-10-CM | POA: Diagnosis not present

## 2022-07-15 DIAGNOSIS — Z20822 Contact with and (suspected) exposure to covid-19: Secondary | ICD-10-CM | POA: Diagnosis not present

## 2022-07-16 DIAGNOSIS — Z20822 Contact with and (suspected) exposure to covid-19: Secondary | ICD-10-CM | POA: Diagnosis not present

## 2022-07-18 ENCOUNTER — Ambulatory Visit (INDEPENDENT_AMBULATORY_CARE_PROVIDER_SITE_OTHER): Payer: Medicare HMO | Admitting: Adult Health

## 2022-07-18 ENCOUNTER — Encounter: Payer: Self-pay | Admitting: Adult Health

## 2022-07-18 VITALS — BP 120/80 | HR 76 | Temp 98.8°F | Ht 71.0 in | Wt 237.0 lb

## 2022-07-18 DIAGNOSIS — E782 Mixed hyperlipidemia: Secondary | ICD-10-CM

## 2022-07-18 DIAGNOSIS — R7303 Prediabetes: Secondary | ICD-10-CM | POA: Diagnosis not present

## 2022-07-18 DIAGNOSIS — Z7689 Persons encountering health services in other specified circumstances: Secondary | ICD-10-CM

## 2022-07-18 NOTE — Patient Instructions (Addendum)
It was great seeing you today   I will see you for your physical exam after February 3rd   If you need anything in the meantime, please let me know

## 2022-07-18 NOTE — Progress Notes (Signed)
Patient presents to clinic today to establish care.  He is transferring from [facility]. His last CPE was in February 2023  Acute Concerns: Establish Care  Chronic Issues: Pre diabetes - last A1c in Feb 2023 was 5.8   Hyperlipidemia - prescribed Crestor 10 mg daily. He denies myalgia or fatigue. He has been on a statin about two years.   History of Skin Cancer - has had a few MOHS procedures, history of BCC and SCC. He is seen at [facility] routinely.  Health Maintenance: Dental -- Routine Care  Vision -- Routine Care Immunizations -- UTD  Colonoscopy -- 2014 - has had a history of polyps.  Diet: Tries to eat healthy, very little fast food and red meats.  Exercise: Does yard work but does not do a lot of exercise routinely.   Past Medical History:  Diagnosis Date   Colon polyps    History of skin cancer    Hyperlipidemia    Pre-diabetes     Past Surgical History:  Procedure Laterality Date   appendectomy  1969   Cataract Bilateral 12/2010   eye lid surgery   2019    Current Outpatient Medications on File Prior to Visit  Medication Sig Dispense Refill   aspirin EC (ASPIR-LOW) 81 MG tablet 1 tablet     Bacillus Coagulans-Inulin (PROBIOTIC) 1-250 BILLION-MG CAPS      Biotin 10000 MCG TABS 1 tablet     cholecalciferol (VITAMIN D3) 25 MCG (1000 UNIT) tablet 1 tablet     cyanocobalamin (VITAMIN B12) 100 MCG tablet See admin instructions.     Magnesium 125 MG CAPS Take 125 mg by mouth. Twice a week     rosuvastatin (CRESTOR) 10 MG tablet Take 10 mg by mouth at bedtime.     tadalafil (CIALIS) 5 MG tablet 1 tablet as needed     No current facility-administered medications on file prior to visit.    Allergies  Allergen Reactions   Clarithromycin Other (See Comments)   Penicillins     Family History  Problem Relation Age of Onset   Cancer Mother    Pancreatic cancer Father     Social History   Socioeconomic History   Marital status:  Married    Spouse name: Not on file   Number of children: Not on file   Years of education: Not on file   Highest education level: Not on file  Occupational History   Not on file  Tobacco Use   Smoking status: Never   Smokeless tobacco: Never  Vaping Use   Vaping Use: Never used  Substance and Sexual Activity   Alcohol use: Yes    Alcohol/week: 7.0 standard drinks of alcohol    Types: 2 Glasses of wine, 3 Cans of beer, 2 Shots of liquor per week   Drug use: No   Sexual activity: Never  Other Topics Concern   Not on file  Social History Narrative   Not on file   Social Determinants of Health   Financial Resource Strain: Not on file  Food Insecurity: Not on file  Transportation Needs: Not on file  Physical Activity: Not on file  Stress: Not on file  Social Connections: Not on file  Intimate Partner Violence: Not on file    ROS  BP 120/80   Pulse 76   Temp 98.8 F (37.1 C) (Oral)   Ht '5\' 11"'$  (1.803 m)   Wt 237 lb (107.5 kg)   SpO2 98%  BMI 33.05 kg/m   Physical Exam Vitals and nursing note reviewed.  Constitutional:      Appearance: Normal appearance. He is obese.  Cardiovascular:     Rate and Rhythm: Normal rate and regular rhythm.     Pulses: Normal pulses.     Heart sounds: Normal heart sounds.  Pulmonary:     Effort: Pulmonary effort is normal.     Breath sounds: Normal breath sounds.  Neurological:     General: No focal deficit present.     Mental Status: He is alert.  Psychiatric:        Mood and Affect: Mood normal.        Behavior: Behavior normal.     Assessment/Plan: 1. Encounter to establish care - CPE in Feb 2024  - Follow up sooner if needed  2. Mixed hyperlipidemia - Continue with Crestor   3. Prediabetes - Continue to monitor  - Work on lifestyle modifications

## 2022-07-19 DIAGNOSIS — Z20822 Contact with and (suspected) exposure to covid-19: Secondary | ICD-10-CM | POA: Diagnosis not present

## 2022-07-20 DIAGNOSIS — Z20822 Contact with and (suspected) exposure to covid-19: Secondary | ICD-10-CM | POA: Diagnosis not present

## 2022-07-23 DIAGNOSIS — Z20822 Contact with and (suspected) exposure to covid-19: Secondary | ICD-10-CM | POA: Diagnosis not present

## 2022-07-24 DIAGNOSIS — Z20822 Contact with and (suspected) exposure to covid-19: Secondary | ICD-10-CM | POA: Diagnosis not present

## 2022-07-26 ENCOUNTER — Encounter: Payer: Self-pay | Admitting: Adult Health

## 2022-07-26 ENCOUNTER — Other Ambulatory Visit: Payer: Self-pay | Admitting: Adult Health

## 2022-07-26 MED ORDER — ROSUVASTATIN CALCIUM 10 MG PO TABS: 10.0000 mg | ORAL_TABLET | Freq: Every day | ORAL | 3 refills | Status: DC

## 2022-07-26 NOTE — Telephone Encounter (Signed)
Ok to send

## 2022-07-27 DIAGNOSIS — Z20822 Contact with and (suspected) exposure to covid-19: Secondary | ICD-10-CM | POA: Diagnosis not present

## 2022-07-28 DIAGNOSIS — Z20822 Contact with and (suspected) exposure to covid-19: Secondary | ICD-10-CM | POA: Diagnosis not present

## 2022-08-15 DIAGNOSIS — L820 Inflamed seborrheic keratosis: Secondary | ICD-10-CM | POA: Diagnosis not present

## 2022-08-15 DIAGNOSIS — D485 Neoplasm of uncertain behavior of skin: Secondary | ICD-10-CM | POA: Diagnosis not present

## 2022-08-15 DIAGNOSIS — C44229 Squamous cell carcinoma of skin of left ear and external auricular canal: Secondary | ICD-10-CM | POA: Diagnosis not present

## 2022-10-02 ENCOUNTER — Ambulatory Visit (INDEPENDENT_AMBULATORY_CARE_PROVIDER_SITE_OTHER): Payer: Medicare HMO

## 2022-10-02 DIAGNOSIS — Z23 Encounter for immunization: Secondary | ICD-10-CM

## 2022-11-14 DIAGNOSIS — L821 Other seborrheic keratosis: Secondary | ICD-10-CM | POA: Diagnosis not present

## 2022-11-14 DIAGNOSIS — D485 Neoplasm of uncertain behavior of skin: Secondary | ICD-10-CM | POA: Diagnosis not present

## 2022-11-14 DIAGNOSIS — Z85828 Personal history of other malignant neoplasm of skin: Secondary | ICD-10-CM | POA: Diagnosis not present

## 2022-11-14 DIAGNOSIS — L820 Inflamed seborrheic keratosis: Secondary | ICD-10-CM | POA: Diagnosis not present

## 2022-11-14 DIAGNOSIS — L570 Actinic keratosis: Secondary | ICD-10-CM | POA: Diagnosis not present

## 2022-11-14 DIAGNOSIS — D0422 Carcinoma in situ of skin of left ear and external auricular canal: Secondary | ICD-10-CM | POA: Diagnosis not present

## 2022-12-05 DIAGNOSIS — M7061 Trochanteric bursitis, right hip: Secondary | ICD-10-CM | POA: Diagnosis not present

## 2022-12-05 DIAGNOSIS — M25551 Pain in right hip: Secondary | ICD-10-CM | POA: Diagnosis not present

## 2022-12-06 ENCOUNTER — Ambulatory Visit (INDEPENDENT_AMBULATORY_CARE_PROVIDER_SITE_OTHER): Payer: Medicare HMO

## 2022-12-06 VITALS — Ht 71.0 in | Wt 237.0 lb

## 2022-12-06 DIAGNOSIS — Z0000 Encounter for general adult medical examination without abnormal findings: Secondary | ICD-10-CM | POA: Diagnosis not present

## 2022-12-06 NOTE — Patient Instructions (Addendum)
Victor Zamora , Thank you for taking time to come for your Medicare Wellness Visit. I appreciate your ongoing commitment to your health goals. Please review the following plan we discussed and let me know if I can assist you in the future.   These are the goals we discussed:  Goals       Stay healthy (pt-stated)      Travel.        This is a list of the screening recommended for you and due dates:  Health Maintenance  Topic Date Due   COVID-19 Vaccine (5 - 2023-24 season) 12/22/2022*   Zoster (Shingles) Vaccine (1 of 2) 03/07/2023*   Colon Cancer Screening  12/07/2023*   Hepatitis C Screening: USPSTF Recommendation to screen - Ages 18-79 yo.  12/07/2023*   DTaP/Tdap/Td vaccine (2 - Td or Tdap) 10/30/2023   Medicare Annual Wellness Visit  12/07/2023   Pneumonia Vaccine  Completed   Flu Shot  Completed   HPV Vaccine  Aged Out  *Topic was postponed. The date shown is not the original due date.    Advanced directives: Please bring a copy of your health care power of attorney and living will to the office to be added to your chart at your convenience.   Conditions/risks identified: None  Next appointment: Follow up in one year for your annual wellness visit.    Preventive Care 33 Years and Older, Male  Preventive care refers to lifestyle choices and visits with your health care provider that can promote health and wellness. What does preventive care include? A yearly physical exam. This is also called an annual well check. Dental exams once or twice a year. Routine eye exams. Ask your health care provider how often you should have your eyes checked. Personal lifestyle choices, including: Daily care of your teeth and gums. Regular physical activity. Eating a healthy diet. Avoiding tobacco and drug use. Limiting alcohol use. Practicing safe sex. Taking low doses of aspirin every day. Taking vitamin and mineral supplements as recommended by your health care provider. What  happens during an annual well check? The services and screenings done by your health care provider during your annual well check will depend on your age, overall health, lifestyle risk factors, and family history of disease. Counseling  Your health care provider may ask you questions about your: Alcohol use. Tobacco use. Drug use. Emotional well-being. Home and relationship well-being. Sexual activity. Eating habits. History of falls. Memory and ability to understand (cognition). Work and work Statistician. Screening  You may have the following tests or measurements: Height, weight, and BMI. Blood pressure. Lipid and cholesterol levels. These may be checked every 5 years, or more frequently if you are over 70 years old. Skin check. Lung cancer screening. You may have this screening every year starting at age 70 if you have a 30-pack-year history of smoking and currently smoke or have quit within the past 15 years. Fecal occult blood test (FOBT) of the stool. You may have this test every year starting at age 70. Flexible sigmoidoscopy or colonoscopy. You may have a sigmoidoscopy every 5 years or a colonoscopy every 10 years starting at age 70. Prostate cancer screening. Recommendations will vary depending on your family history and other risks. Hepatitis C blood test. Hepatitis B blood test. Sexually transmitted disease (STD) testing. Diabetes screening. This is done by checking your blood sugar (glucose) after you have not eaten for a while (fasting). You may have this done every 1-3 years. Abdominal  aortic aneurysm (AAA) screening. You may need this if you are a current or former smoker. Osteoporosis. You may be screened starting at age 36 if you are at high risk. Talk with your health care provider about your test results, treatment options, and if necessary, the need for more tests. Vaccines  Your health care provider may recommend certain vaccines, such as: Influenza vaccine. This  is recommended every year. Tetanus, diphtheria, and acellular pertussis (Tdap, Td) vaccine. You may need a Td booster every 10 years. Zoster vaccine. You may need this after age 60. Pneumococcal 13-valent conjugate (PCV13) vaccine. One dose is recommended after age 70. Pneumococcal polysaccharide (PPSV23) vaccine. One dose is recommended after age 70. Talk to your health care provider about which screenings and vaccines you need and how often you need them. This information is not intended to replace advice given to you by your health care provider. Make sure you discuss any questions you have with your health care provider. Document Released: 12/08/2015 Document Revised: 07/31/2016 Document Reviewed: 09/12/2015 Elsevier Interactive Patient Education  2017 [employer] Prevention in the Home Falls can cause injuries. They can happen to people of all ages. There are many things you can do to make your home safe and to help prevent falls. What can I do on the outside of my home? Regularly fix the edges of walkways and driveways and fix any cracks. Remove anything that might make you trip as you walk through a door, such as a raised step or threshold. Trim any bushes or trees on the path to your home. Use bright outdoor lighting. Clear any walking paths of anything that might make someone trip, such as rocks or tools. Regularly check to see if handrails are loose or broken. Make sure that both sides of any steps have handrails. Any raised decks and porches should have guardrails on the edges. Have any leaves, snow, or ice cleared regularly. Use sand or salt on walking paths during winter. Clean up any spills in your garage right away. This includes oil or grease spills. What can I do in the bathroom? Use night lights. Install grab bars by the toilet and in the tub and shower. Do not use towel bars as grab bars. Use non-skid mats or decals in the tub or shower. If you need to sit down  in the shower, use a plastic, non-slip stool. Keep the floor dry. Clean up any water that spills on the floor as soon as it happens. Remove soap buildup in the tub or shower regularly. Attach bath mats securely with double-sided non-slip rug tape. Do not have throw rugs and other things on the floor that can make you trip. What can I do in the bedroom? Use night lights. Make sure that you have a light by your bed that is easy to reach. Do not use any sheets or blankets that are too big for your bed. They should not hang down onto the floor. Have a firm chair that has side arms. You can use this for support while you get dressed. Do not have throw rugs and other things on the floor that can make you trip. What can I do in the kitchen? Clean up any spills right away. Avoid walking on wet floors. Keep items that you use a lot in easy-to-reach places. If you need to reach something above you, use a strong step stool that has a grab bar. Keep electrical cords out of the way. Do not use  floor polish or wax that makes floors slippery. If you must use wax, use non-skid floor wax. Do not have throw rugs and other things on the floor that can make you trip. What can I do with my stairs? Do not leave any items on the stairs. Make sure that there are handrails on both sides of the stairs and use them. Fix handrails that are broken or loose. Make sure that handrails are as long as the stairways. Check any carpeting to make sure that it is firmly attached to the stairs. Fix any carpet that is loose or worn. Avoid having throw rugs at the top or bottom of the stairs. If you do have throw rugs, attach them to the floor with carpet tape. Make sure that you have a light switch at the top of the stairs and the bottom of the stairs. If you do not have them, ask someone to add them for you. What else can I do to help prevent falls? Wear shoes that: Do not have high heels. Have rubber bottoms. Are comfortable  and fit you well. Are closed at the toe. Do not wear sandals. If you use a stepladder: Make sure that it is fully opened. Do not climb a closed stepladder. Make sure that both sides of the stepladder are locked into place. Ask someone to hold it for you, if possible. Clearly mark and make sure that you can see: Any grab bars or handrails. First and last steps. Where the edge of each step is. Use tools that help you move around (mobility aids) if they are needed. These include: Canes. Walkers. Scooters. Crutches. Turn on the lights when you go into a dark area. Replace any light bulbs as soon as they burn out. Set up your furniture so you have a clear path. Avoid moving your furniture around. If any of your floors are uneven, fix them. If there are any pets around you, be aware of where they are. Review your medicines with your doctor. Some medicines can make you feel dizzy. This can increase your chance of falling. Ask your doctor what other things that you can do to help prevent falls. This information is not intended to replace advice given to you by your health care provider. Make sure you discuss any questions you have with your health care provider. Document Released: 09/07/2009 Document Revised: 04/18/2016 Document Reviewed: 12/16/2014 Elsevier Interactive Patient Education  2017 [employer].

## 2022-12-06 NOTE — Progress Notes (Signed)
Subjective:   Victor Zamora is a 72 y.o. male who presents for Medicare Annual/Subsequent preventive examination.  Review of Systems    Virtual Visit via Telephone Note  I connected with  Victor Zamora on 12/06/22 at  2:45 PM EST by telephone and verified that I am speaking with the correct person using two identifiers.  Location: Patient: Home Provider: office Persons participating in the virtual visit: patient/Nurse Health Advisor   I discussed the limitations, risks, security and privacy concerns of performing an evaluation and management service by telephone and the availability of in person appointments. The patient expressed understanding and agreed to proceed.  Interactive audio and video telecommunications were attempted between this nurse and patient, however failed, due to patient having technical difficulties OR patient did not have access to video capability.  We continued and completed visit with audio only.  Some vital signs may be absent or patient reported.   Criselda Peaches, LPN  Cardiac Risk Factors include: advanced age (>37mn, >>32women);male gender     Objective:    Today's Vitals   12/06/22 1447  Weight: 237 lb (107.5 kg)  Height: '5\' 11"'$  (1.803 m)   Body mass index is 33.05 kg/m.     12/06/2022    2:53 PM 02/25/2015    8:43 PM  Advanced Directives  Does Patient Have a Medical Advance Directive? Yes No;Yes  Type of AParamed[place]of [place]ast[place] will Living will  Copy of [place] Agencyin Chart? No - copy requested No - copy requested  Would patient like information on creating a medical advance directive?  Yes - Educational materials given    Current Medications (verified) Outpatient Encounter Medications as of 12/06/2022  Medication Sig   aspirin EC (ASPIR-LOW) 81 MG tablet 1 tablet   Biotin 10000 MCG TABS 1 tablet   cholecalciferol (VITAMIN D3) 25 MCG (1000 UNIT) tablet 1 tablet   cyanocobalamin (VITAMIN  B12) 100 MCG tablet See admin instructions.   rosuvastatin (CRESTOR) 10 MG tablet Take 1 tablet (10 mg total) by mouth at bedtime.   [DISCONTINUED] Bacillus Coagulans-Inulin (PROBIOTIC) 1-250 BILLION-MG CAPS    [DISCONTINUED] Magnesium 125 MG CAPS Take 125 mg by mouth. Twice a week   [DISCONTINUED] tadalafil (CIALIS) 5 MG tablet 1 tablet as needed   No facility-administered encounter medications on file as of 12/06/2022.    Allergies (verified) Clarithromycin and Penicillins   History: Past Medical History:  Diagnosis Date   Colon polyps    History of skin cancer    Hyperlipidemia    Pre-diabetes    Past Surgical History:  Procedure Laterality Date   appendectomy  1969   Cataract Bilateral 12/2010   eye lid surgery   2019   Family History  Problem Relation Age of Onset   Cancer Mother    Pancreatic cancer Father    Social History   Socioeconomic History   Marital status: Married    Spouse name: Not on file   Number of children: Not on file   Years of education: Not on file   Highest education level: Not on file  Occupational History   Not on file  Tobacco Use   Smoking status: Never   Smokeless tobacco: Never  Vaping Use   Vaping Use: Never used  Substance and Sexual Activity   Alcohol use: Yes    Alcohol/week: 7.0 standard drinks of alcohol    Types: 2 Glasses of wine, 3 Cans of beer, 2  Shots of liquor per week   Drug use: No   Sexual activity: Never  Other Topics Concern   Not on file  Social History Narrative   Not on file   Social Determinants of Health   Financial Resource Strain: Low Risk  (12/06/2022)   Overall Financial Resource Strain (CARDIA)    Difficulty of Paying Living Expenses: Not hard at all  Food Insecurity: No Food Insecurity (12/06/2022)   Hunger Vital Sign    Worried About Running Out of Food in the Last Year: Never true    Ran Out of Food in the Last Year: Never true  Transportation Needs: No Transportation Needs (12/06/2022)    PRAPARE - Hydrologist (Medical): No    Lack of Transportation (Non-Medical): No  Physical Activity: Sufficiently Active (12/06/2022)   Exercise Vital Sign    Days of Exercise per Week: 3 days    Minutes of Exercise per Session: 60 min  Stress: No Stress Concern Present (12/06/2022)   [place]    Feeling of Stress : Not at all  Social Connections: [place] (12/06/2022)   Social Connection and Isolation Panel [NHANES]    Frequency of Communication with Friends and Family: More than three times a week    Frequency of Social Gatherings with Friends and Family: More than three times a week    Attends Religious Services: More than 4 times per year    Active Member of [employer] or Organizations: Yes    Attends Music therapist: More than 4 times per year    Marital Status: Married    Tobacco Counseling Counseling given: Not Answered   Clinical Intake:  Pre-visit preparation completed: Yes  Pain : No/denies pain     BMI - recorded: 33.05 Nutritional Status: BMI > 30  Obese Nutritional Risks: None Diabetes: No  How often do you need to have someone help you when you read instructions, pamphlets, or other written materials from your doctor or pharmacy?: 1 - Never  Diabetic?  No  Interpreter Needed?: No  Information entered by :: Rolene Arbour LPN   Activities of Daily Living    12/06/2022    2:52 PM 12/02/2022   10:53 AM  In your present state of health, do you have any difficulty performing the following activities:  Hearing? 0 0  Vision? 0 0  Difficulty concentrating or making decisions? 0 0  Walking or climbing stairs? 0 0  Dressing or bathing? 0 0  Doing errands, shopping? 0 0  Preparing Food and eating ? N   Using the Toilet? N N  In the past six months, have you accidently leaked urine? N N  Do you have problems with loss of bowel control? N N   Managing your Medications? N N  Managing your Finances? N N  Housekeeping or managing your Housekeeping? N N    Patient Care Team: Dorothyann Peng, NP as PCP - General (Family Medicine)  Indicate any recent Medical Services you may have received from other than [facility] providers in the past year (date may be approximate).     Assessment:   This is a routine wellness examination for Victor Zamora.  Hearing/Vision screen Hearing Screening - Comments:: Denies hearing difficulties   Vision Screening - Comments:: Wears rx glasses - up to date with routine eye exams with  [place] issues and exercise activities discussed: Exercise limited by: None identified  Goals Addressed               This Visit's Progress     Stay healthy (pt-stated)        Travel.       Depression Screen    07/18/2022    8:34 AM  PHQ 2/9 Scores  PHQ - 2 Score 0    Fall Risk    12/06/2022    2:52 PM 12/02/2022   10:53 AM  [place] in the past year? 0 0  Number falls in past yr: 0   Injury with Fall? 0   Risk for fall due to : No Fall Risks   Follow up Falls prevention discussed     Plain:  Any stairs in or around the home? Yes  If so, are there any without handrails? No  Home free of loose throw rugs in walkways, pet beds, electrical cords, etc? Yes  Adequate lighting in your home to reduce risk of falls? Yes   ASSISTIVE DEVICES UTILIZED TO PREVENT FALLS:  Life alert? No  Use of a cane, walker or w/c? No  Grab bars in the bathroom? No  Shower chair or bench in shower? Yes  Elevated toilet seat or a handicapped toilet? Yes   TIMED UP AND GO:  Was the test performed? No . Audio Visit   Cognitive Function:        12/06/2022    2:53 PM  6CIT Screen  What Year? 0 points  What month? 0 points  What time? 0 points  Count back from 20 0 points  Months in reverse 0 points  Repeat phrase 0 points  Total Score 0 points     Immunizations Immunization History  Administered Date(s) Administered   Fluad Quad(high Dose 65+) 10/02/2022   Influenza Split 10/11/2018, 08/26/2019, 10/27/2021   PFIZER(Purple Top)SARS-COV-2 Vaccination 12/25/2019, 01/15/2020, 10/24/2020, 03/01/2021   PNEUMOCOCCAL CONJUGATE-20 07/13/2022   Respiratory Syncytial Virus Vaccine,Recomb Aduvanted(Arexvy) 07/25/2022   Tdap 10/29/2013    TDAP status: Up to date  Flu Vaccine status: Up to date  Pneumococcal vaccine status: Up to date  Covid-19 vaccine status: Completed vaccines  Qualifies for Shingles Vaccine? Yes   Zostavax completed No   Shingrix Completed?: No.    Education has been provided regarding the importance of this vaccine. Patient has been advised to call insurance company to determine out of pocket expense if they have not yet received this vaccine. Advised may also receive vaccine at local pharmacy or Health Dept. Verbalized acceptance and understanding.  Screening Tests Health Maintenance  Topic Date Due   COVID-19 Vaccine (5 - 2023-24 season) 12/22/2022 (Originally 07/26/2022)   Zoster Vaccines- Shingrix (1 of 2) 03/07/2023 (Originally 12/22/2002)   COLONOSCOPY (Pts 45-43yr Insurance coverage will need to be confirmed)  12/07/2023 (Originally 12/22/1997)   Hepatitis C Screening  12/07/2023 (Originally [birth year]1970)   DTaP/Tdap/Td (2 - Td or Tdap) 10/30/2023   Medicare Annual Wellness (AWV)  12/07/2023   Pneumonia Vaccine 70 Years old  Completed   INFLUENZA VACCINE  Completed   HPV VACCINES  Aged Out    Health Maintenance  There are no preventive care reminders to display for this patient.   Colorectal cancer screening: Referral to GI placed Patient Deferred. Pt aware the office will call re: appt.  Lung Cancer Screening: (Low Dose CT Chest recommended if Age 70-80years, 30 pack-year currently smoking OR have quit w/in 15years.) does not qualify.  Additional Screening:  Hepatitis C Screening: does  qualify; Completed Deferred  Vision Screening: Recommended annual ophthalmology exams for early detection of glaucoma and other disorders of the eye. Is the patient up to date with their annual eye exam?  Yes  Who is the provider or what is the name of the office in which the patient attends annual eye exams? [facility] If pt is not established with a provider, would they like to be referred to a provider to establish care? No .   Dental Screening: Recommended annual dental exams for proper oral hygiene  Community Resource Referral / Chronic Care Management:  CRR required this visit?  No   CCM required this visit?  No      Plan:     I have personally reviewed and noted the following in the patient's chart:   Medical and social history Use of alcohol, tobacco or illicit drugs  Current medications and supplements including opioid prescriptions. Patient is not currently taking opioid prescriptions. Functional ability and status Nutritional status Physical activity Advanced directives List of other physicians Hospitalizations, surgeries, and ER visits in previous 12 months Vitals Screenings to include cognitive, depression, and falls Referrals and appointments  In addition, I have reviewed and discussed with patient certain preventive protocols, quality metrics, and best practice recommendations. A written personalized care plan for preventive services as well as general preventive health recommendations were provided to patient.     Criselda Peaches, LPN   04/02/3266   Nurse Notes: patient due Hep-C Screening

## 2023-01-06 ENCOUNTER — Encounter: Payer: Self-pay | Admitting: Adult Health

## 2023-01-09 ENCOUNTER — Encounter: Payer: Self-pay | Admitting: Internal Medicine

## 2023-01-09 ENCOUNTER — Ambulatory Visit (INDEPENDENT_AMBULATORY_CARE_PROVIDER_SITE_OTHER): Payer: Medicare HMO | Admitting: Adult Health

## 2023-01-09 ENCOUNTER — Encounter: Payer: Self-pay | Admitting: Adult Health

## 2023-01-09 VITALS — BP 120/82 | HR 70 | Temp 98.6°F | Ht 71.0 in | Wt 240.0 lb

## 2023-01-09 DIAGNOSIS — N400 Enlarged prostate without lower urinary tract symptoms: Secondary | ICD-10-CM

## 2023-01-09 DIAGNOSIS — Z0000 Encounter for general adult medical examination without abnormal findings: Secondary | ICD-10-CM | POA: Diagnosis not present

## 2023-01-09 DIAGNOSIS — Z1211 Encounter for screening for malignant neoplasm of colon: Secondary | ICD-10-CM | POA: Diagnosis not present

## 2023-01-09 DIAGNOSIS — R7303 Prediabetes: Secondary | ICD-10-CM | POA: Diagnosis not present

## 2023-01-09 DIAGNOSIS — E782 Mixed hyperlipidemia: Secondary | ICD-10-CM

## 2023-01-09 LAB — HEMOGLOBIN A1C: Hgb A1c MFr Bld: 5.9 % (ref 4.6–6.5)

## 2023-01-09 LAB — COMPREHENSIVE METABOLIC PANEL
ALT: 32 U/L (ref 0–53)
AST: 22 U/L (ref 0–37)
Albumin: 4.4 g/dL (ref 3.5–5.2)
Alkaline Phosphatase: 48 U/L (ref 39–117)
BUN: 18 mg/dL (ref 6–23)
CO2: 29 mEq/L (ref 19–32)
Calcium: 9.7 mg/dL (ref 8.4–10.5)
Chloride: 101 mEq/L (ref 96–112)
Creatinine, Ser: 0.95 mg/dL (ref 0.40–1.50)
GFR: 81.36 mL/min (ref >60.00)
Glucose, Bld: 116 mg/dL — ABNORMAL HIGH (ref 70–99)
Potassium: 4.4 mEq/L (ref 3.5–5.1)
Sodium: 139 mEq/L (ref 135–145)
Total Bilirubin: 1.1 mg/dL (ref 0.2–1.2)
Total Protein: 7.1 g/dL (ref 6.0–8.3)

## 2023-01-09 LAB — CBC
HCT: 45.7 % (ref 39.0–52.0)
Hemoglobin: 15.8 g/dL (ref 13.0–17.0)
MCHC: 34.6 g/dL (ref 30.0–36.0)
MCV: 93.1 fl (ref 78.0–100.0)
Platelets: 196 10*3/uL (ref 150.0–400.0)
RBC: 4.92 Mil/uL (ref 4.22–5.81)
RDW: 13.1 % (ref 11.5–15.5)
WBC: 6.1 10*3/uL (ref 4.0–10.5)

## 2023-01-09 LAB — LIPID PANEL
Cholesterol: 135 mg/dL (ref 0–200)
HDL: 63.1 mg/dL (ref >39.00)
LDL Cholesterol: 55 mg/dL (ref 0–99)
NonHDL: 71.76
Total CHOL/HDL Ratio: 2
Triglycerides: 84 mg/dL (ref 0.0–149.0)
VLDL: 16.8 mg/dL (ref 0.0–40.0)

## 2023-01-09 LAB — PSA: PSA: 0.73 ng/mL (ref 0.10–4.00)

## 2023-01-09 LAB — TSH: TSH: 2.34 u[IU]/mL (ref 0.35–5.50)

## 2023-01-09 NOTE — Patient Instructions (Addendum)
It was great seeing you today   We will follow up with you regarding your lab work   Please let me know if you need anything   Try using Omeprazole OTC daily x 2 weeks

## 2023-01-09 NOTE — Progress Notes (Signed)
Subjective:    Patient ID: Victor Zamora, male    DOB: 1951-11-21, 72 y.o.   MRN: [id number]  HPI Patient presents for yearly preventative medicine examination. He is a pleasant 72 year old male who  has a past medical history of Colon polyps, History of skin cancer, Hyperlipidemia, and Pre-diabetes.  Hyperlipidemia - manages with crestor 10 mg daily. He denies myalgia or fatigue   Pre Diabetes - not currently on medication. He recalls that his last A1c in Feb 2023 was 5.8   History of skin cancer - BCC and SCC. Followed by [facility] on a routine basis.  BPH - asymptomatic   All immunizations and health maintenance protocols were reviewed with the patient and needed orders were placed.  Appropriate screening laboratory values were ordered for the patient including screening of hyperlipidemia, renal function and hepatic function. If indicated by BPH, a PSA was ordered.  Medication reconciliation,  past medical history, social history, problem list and allergies were reviewed in detail with the patient  Goals were established with regard to weight loss, exercise, and  diet in compliance with medications Wt Readings from Last 3 Encounters:  01/09/23 240 lb (108.9 kg)  12/06/22 237 lb (107.5 kg)  07/18/22 237 lb (107.5 kg)   He is due for colon cancer screening.    Review of Systems  Constitutional: Negative.   HENT: Negative.    Eyes: Negative.   Respiratory: Negative.    Cardiovascular: Negative.   Gastrointestinal: Negative.   Endocrine: Negative.   Genitourinary: Negative.   Musculoskeletal: Negative.   Skin: Negative.   Allergic/Immunologic: Negative.   Neurological: Negative.   Hematological: Negative.   Psychiatric/Behavioral: Negative.    All other systems reviewed and are negative.  Past Medical History:  Diagnosis Date   Colon polyps    History of skin cancer    Hyperlipidemia    Pre-diabetes     Social History   Socioeconomic History    Marital status: Married    Spouse name: Not on file   Number of children: Not on file   Years of education: Not on file   Highest education level: Not on file  Occupational History   Not on file  Tobacco Use   Smoking status: Never   Smokeless tobacco: Never  Vaping Use   Vaping Use: Never used  Substance and Sexual Activity   Alcohol use: Yes    Alcohol/week: 7.0 standard drinks of alcohol    Types: 2 Glasses of wine, 3 Cans of beer, 2 Shots of liquor per week   Drug use: No   Sexual activity: Never  Other Topics Concern   Not on file  Social History Narrative   Not on file   Social Determinants of Health   Financial Resource Strain: Low Risk  (12/06/2022)   Overall Financial Resource Strain (CARDIA)    Difficulty of Paying Living Expenses: Not hard at all  Food Insecurity: No Food Insecurity (12/06/2022)   Hunger Vital Sign    Worried About Running Out of Food in the Last Year: Never true    Ran Out of Food in the Last Year: Never true  Transportation Needs: No Transportation Needs (12/06/2022)   PRAPARE - Hydrologist (Medical): No    Lack of Transportation (Non-Medical): No  Physical Activity: Sufficiently Active (12/06/2022)   Exercise Vital Sign    Days of Exercise per Week: 3 days    Minutes of Exercise per Session:  60 min  Stress: No Stress Concern Present (12/06/2022)   [place]    Feeling of Stress : Not at all  Social Connections: Socially Integrated (12/06/2022)   Social Connection and Isolation Panel [NHANES]    Frequency of Communication with Friends and Family: More than three times a week    Frequency of Social Gatherings with Friends and Family: More than three times a week    Attends Religious Services: More than 4 times per year    Active Member of [employer] or Organizations: Yes    Attends Music therapist: More than 4 times per year    Marital  Status: Married  Human resources officer Violence: Not At Risk (12/06/2022)   Humiliation, Afraid, Rape, and Kick questionnaire    Fear of Current or Ex-Partner: No    Emotionally Abused: No    Physically Abused: No    Sexually Abused: No    Past Surgical History:  Procedure Laterality Date   appendectomy  1969   Cataract Bilateral 12/2010   eye lid surgery   2019    Family History  Problem Relation Age of Onset   Cancer Mother    Pancreatic cancer Father     Allergies  Allergen Reactions   Clarithromycin Other (See Comments)   Penicillins     Current Outpatient Medications on File Prior to Visit  Medication Sig Dispense Refill   aspirin EC (ASPIR-LOW) 81 MG tablet 1 tablet     Biotin 10000 MCG TABS 1 tablet     cholecalciferol (VITAMIN D3) 25 MCG (1000 UNIT) tablet 1 tablet     cyanocobalamin (VITAMIN B12) 100 MCG tablet See admin instructions.     rosuvastatin (CRESTOR) 10 MG tablet Take 1 tablet (10 mg total) by mouth at bedtime. 90 tablet 3   tadalafil (CIALIS) 5 MG tablet 1 tablet as needed Orally Once a day for 30 days     No current facility-administered medications on file prior to visit.    BP 120/82   Pulse 70   Temp 98.6 F (37 C) (Oral)   Ht 5' 11"$  (1.803 m)   Wt 240 lb (108.9 kg)   SpO2 96%   BMI 33.47 kg/m       Objective:   Physical Exam Vitals and nursing note reviewed.  Constitutional:      General: He is not in acute distress.    Appearance: Normal appearance. He is not ill-appearing.  HENT:     Head: Normocephalic and atraumatic.     Right Ear: Tympanic membrane, ear canal and external ear normal. There is no impacted cerumen.     Left Ear: Tympanic membrane, ear canal and external ear normal. There is no impacted cerumen.     Nose: Nose normal. No congestion or rhinorrhea.     Mouth/Throat:     Mouth: Mucous membranes are moist.     Pharynx: Oropharynx is clear.  Eyes:     Extraocular Movements: Extraocular movements intact.      Conjunctiva/sclera: Conjunctivae normal.     Pupils: Pupils are equal, round, and reactive to light.  Neck:     Vascular: No carotid bruit.  Cardiovascular:     Rate and Rhythm: Normal rate and regular rhythm.     Pulses: Normal pulses.     Heart sounds: No murmur heard.    No friction rub. No gallop.  Pulmonary:     Effort: Pulmonary effort is normal.  Breath sounds: Normal breath sounds.  Abdominal:     General: Abdomen is flat. Bowel sounds are normal. There is no distension.     Palpations: Abdomen is soft. There is no mass.     Tenderness: There is no abdominal tenderness. There is no guarding or rebound.     Hernia: No hernia is present.  Musculoskeletal:        General: Normal range of motion.     Cervical back: Normal range of motion and neck supple.  Lymphadenopathy:     Cervical: No cervical adenopathy.  Skin:    General: Skin is warm and dry.     Capillary Refill: Capillary refill takes less than 2 seconds.     Comments: Skin on face red and irritated from chemo cream treatment for skin cancer   Neurological:     General: No focal deficit present.     Mental Status: He is alert and oriented to person, place, and time.  Psychiatric:        Mood and Affect: Mood normal.        Behavior: Behavior normal.        Thought Content: Thought content normal.        Judgment: Judgment normal.        Assessment & Plan:  1. Routine general medical examination at health care facility Today patient counseled on age appropriate routine health concerns for screening and prevention, each reviewed and up to date or declined. Immunizations reviewed and up to date or declined. Labs ordered and reviewed. Risk factors for depression reviewed and negative. Hearing function and visual acuity are intact. ADLs screened and addressed as needed. Functional ability and level of safety reviewed and appropriate. Education, counseling and referrals performed based on assessed risks today.  Patient provided with a copy of personalized plan for preventive services. - Follow up in one year or sooner if needed - Shingles vaccination at pharmacy  2. Mixed hyperlipidemia - Consider increase in stain  - Lipid panel; Future - TSH; Future - CBC; Future - Comprehensive metabolic panel; Future  3. Prediabetes - Consider Metformin  - Lipid panel; Future - TSH; Future - CBC; Future - Comprehensive metabolic panel; Future - Hemoglobin A1c; Future  4. Colon cancer screening  - Ambulatory referral to Gastroenterology  5. Benign prostatic hyperplasia without lower urinary tract symptoms  - PSA; Future  Dorothyann Peng, NP

## 2023-01-14 ENCOUNTER — Encounter: Payer: Self-pay | Admitting: Adult Health

## 2023-01-20 DIAGNOSIS — C44219 Basal cell carcinoma of skin of left ear and external auricular canal: Secondary | ICD-10-CM | POA: Diagnosis not present

## 2023-01-27 DIAGNOSIS — C44722 Squamous cell carcinoma of skin of right lower limb, including hip: Secondary | ICD-10-CM | POA: Diagnosis not present

## 2023-01-27 DIAGNOSIS — D485 Neoplasm of uncertain behavior of skin: Secondary | ICD-10-CM | POA: Diagnosis not present

## 2023-01-28 ENCOUNTER — Ambulatory Visit (AMBULATORY_SURGERY_CENTER): Payer: Medicare HMO

## 2023-01-28 VITALS — Ht 71.0 in | Wt 236.0 lb

## 2023-01-28 DIAGNOSIS — Z1211 Encounter for screening for malignant neoplasm of colon: Secondary | ICD-10-CM

## 2023-01-28 MED ORDER — NA SULFATE-K SULFATE-MG SULF 17.5-3.13-1.6 GM/177ML PO SOLN: 1.0000 | Freq: Once | ORAL | 0 refills | Status: AC

## 2023-01-28 NOTE — Progress Notes (Signed)

## 2023-02-20 DIAGNOSIS — D0421 Carcinoma in situ of skin of right ear and external auricular canal: Secondary | ICD-10-CM | POA: Diagnosis not present

## 2023-02-20 DIAGNOSIS — D485 Neoplasm of uncertain behavior of skin: Secondary | ICD-10-CM | POA: Diagnosis not present

## 2023-02-20 DIAGNOSIS — L821 Other seborrheic keratosis: Secondary | ICD-10-CM | POA: Diagnosis not present

## 2023-02-20 DIAGNOSIS — L570 Actinic keratosis: Secondary | ICD-10-CM | POA: Diagnosis not present

## 2023-02-25 ENCOUNTER — Encounter: Payer: Self-pay | Admitting: Adult Health

## 2023-02-26 NOTE — Telephone Encounter (Signed)
Please advise 

## 2023-02-27 ENCOUNTER — Ambulatory Visit (AMBULATORY_SURGERY_CENTER): Payer: Medicare HMO | Admitting: Internal Medicine

## 2023-02-27 ENCOUNTER — Encounter: Payer: Self-pay | Admitting: Internal Medicine

## 2023-02-27 VITALS — BP 115/75 | HR 64 | Temp 97.5°F | Resp 12 | Ht 71.0 in | Wt 238.6 lb

## 2023-02-27 DIAGNOSIS — E785 Hyperlipidemia, unspecified: Secondary | ICD-10-CM | POA: Diagnosis not present

## 2023-02-27 DIAGNOSIS — K635 Polyp of colon: Secondary | ICD-10-CM | POA: Diagnosis not present

## 2023-02-27 DIAGNOSIS — Z1211 Encounter for screening for malignant neoplasm of colon: Secondary | ICD-10-CM

## 2023-02-27 DIAGNOSIS — D122 Benign neoplasm of ascending colon: Secondary | ICD-10-CM

## 2023-02-27 DIAGNOSIS — D123 Benign neoplasm of transverse colon: Secondary | ICD-10-CM

## 2023-02-27 DIAGNOSIS — Z86010 Personal history of colonic polyps: Secondary | ICD-10-CM | POA: Diagnosis not present

## 2023-02-27 DIAGNOSIS — R7303 Prediabetes: Secondary | ICD-10-CM | POA: Diagnosis not present

## 2023-02-27 MED ORDER — SODIUM CHLORIDE 0.9 % IV SOLN: 500.0000 mL | INTRAVENOUS | Status: DC

## 2023-02-27 NOTE — Patient Instructions (Addendum)
Recommendation:  - Repeat colonoscopy in 3 years for surveillance.                           - Patient has a contact number available for                            emergencies. The signs and symptoms of potential                            delayed complications were discussed with the                            patient. Return to normal activities tomorrow.                            Written discharge instructions were provided to the                            patient.                           - Resume previous diet.                           - Continue present medications.                           - Await pathology results.  Handouts on polyps and diverticulosis given.  YOU HAD AN ENDOSCOPIC PROCEDURE TODAY AT [facility]:   Refer to the procedure report that was given to you for any specific questions about what was found during the examination.  If the procedure report does not answer your questions, please call your gastroenterologist to clarify.  If you requested that your care partner not be given the details of your procedure findings, then the procedure report has been included in a sealed envelope for you to review at your convenience later.  YOU SHOULD EXPECT: Some feelings of bloating in the abdomen. Passage of more gas than usual.  Walking can help get rid of the air that was put into your GI tract during the procedure and reduce the bloating. If you had a lower endoscopy (such as a colonoscopy or flexible sigmoidoscopy) you may notice spotting of blood in your stool or on the toilet paper. If you underwent a bowel prep for your procedure, you may not have a normal bowel movement for a few days.  Please Note:  You might notice some irritation and congestion in your nose or some drainage.  This is from the oxygen used during your procedure.  There is no need for concern and it should clear up in a day or so.  SYMPTOMS TO REPORT IMMEDIATELY:  Following lower  endoscopy (colonoscopy or flexible sigmoidoscopy):  Excessive amounts of blood in the stool  Significant tenderness or worsening of abdominal pains  Swelling of the abdomen that is new, acute  Fever of 100F or high   For urgent or emergent issues, a gastroenterologist can be reached at any hour by calling [phone]. Do not use MyChart messaging for urgent concerns.    DIET:  We  do recommend a small meal at first, but then you may proceed to your regular diet.  Drink plenty of fluids but you should avoid alcoholic beverages for 24 hours.  ACTIVITY:  You should plan to take it easy for the rest of today and you should NOT DRIVE or use heavy machinery until tomorrow (because of the sedation medicines used during the test).    FOLLOW UP: Our staff will call the number listed on your records the next business day following your procedure.  We will call around 7:15- 8:00 am to check on you and address any questions or concerns that you may have regarding the information given to you following your procedure. If we do not reach you, we will leave a message.     If any biopsies were taken you will be contacted by phone or by letter within the next 1-3 weeks.  Please call us at [phone] if you have not heard about the biopsies in 3 weeks.    SIGNATURES/CONFIDENTIALITY: You and/or your care partner have signed paperwork which will be entered into your electronic medical record.  These signatures attest to the fact that that the information above on your After Visit Summary has been reviewed and is understood.  Full responsibility of the confidentiality of this discharge information lies with you and/or your care-partner.

## 2023-02-27 NOTE — Progress Notes (Signed)
Pt resting comfortably. VSS. Airway intact. SBAR complete to RN. All questions answered.   

## 2023-02-27 NOTE — Progress Notes (Signed)
Called to room to assist during endoscopic procedure.  Patient ID and intended procedure confirmed with present staff. Received instructions for my participation in the procedure from the performing physician.  

## 2023-02-27 NOTE — Progress Notes (Signed)
HISTORY OF PRESENT ILLNESS:  Victor Zamora is a 72 y.o. male who is sent today for screening colonoscopy.  No complaints.  He tells me that he had colonoscopy 15 years ago in [place] and had polyps.  No details.  He followed up in 5 years for a second colonoscopy.  No details.  Was contacted at 5 years for follow-up.  He declined.  Follows up at this time.  REVIEW OF SYSTEMS:  All non-GI ROS negative except for  Past Medical History:  Diagnosis Date   Cataract    Colon polyps    History of skin cancer    Hyperlipidemia    Pre-diabetes     Past Surgical History:  Procedure Laterality Date   appendectomy  1969   Cataract Bilateral 12/2010   COLONOSCOPY     eye lid surgery   2019    Social History Victor Zamora  reports that he has never smoked. He has never used smokeless tobacco. He reports current alcohol use of about 7.0 standard drinks of alcohol per week. He reports that he does not use drugs.  family history includes Cancer in his mother; Pancreatic cancer in his father.  Allergies  Allergen Reactions   Clarithromycin Other (See Comments)   Penicillins        PHYSICAL EXAMINATION: Vital signs: BP 123/72   Pulse 74   Temp (!) 97.5 F (36.4 C) (Skin)   Resp 17   Ht 5\' 11"  (1.803 m)   Wt 238 lb 9.6 oz (108.2 kg)   SpO2 99%   BMI 33.28 kg/m  General: Well-developed, well-nourished, no acute distress HEENT: Sclerae are anicteric, conjunctiva pink. Oral mucosa intact Lungs: Clear Heart: Regular Abdomen: soft, nontender, nondistended, no obvious ascites, no peritoneal signs, normal bowel sounds. No organomegaly. Extremities: No edema Psychiatric: alert and oriented x3. Cooperative     ASSESSMENT:  Colon cancer surveillance.  Prior history of polyps reported.  No details   PLAN:  Screening colonoscopy

## 2023-02-27 NOTE — Op Note (Signed)
[place] Patient Name: Victor Zamora Procedure Date: 02/27/2023 11:54 AM MRN: [id number] Endoscopist: Docia Chuck. Henrene Pastor , MD, [id number] Age: 72 Referring MD:  Date of Birth: 03-May-1951 Gender: Male Account #: [id number] Procedure:                Colonoscopy with cold snare polypectomy x 3; biopsy                            polypectomy x 3 Indications:              Screening for colorectal malignant neoplasm Medicines:                Monitored Anesthesia Care Procedure:                Pre-Anesthesia Assessment:                           - Prior to the procedure, a History and Physical                            was performed, and patient medications and                            allergies were reviewed. The patient's tolerance of                            previous anesthesia was also reviewed. The risks                            and benefits of the procedure and the sedation                            options and risks were discussed with the patient.                            All questions were answered, and informed consent                            was obtained. Prior Anticoagulants: The patient has                            taken no anticoagulant or antiplatelet agents. ASA                            Grade Assessment: II - A patient with mild systemic                            disease. After reviewing the risks and benefits,                            the patient was deemed in satisfactory condition to                            undergo the procedure.  After obtaining informed consent, the colonoscope                            was passed under direct vision. Throughout the                            procedure, the patient's blood pressure, pulse, and                            oxygen saturations were monitored continuously. The                            Olympus CF-HQ190L [id number]) Colonoscope was                            introduced through  the anus and advanced to the the                            cecum, identified by appendiceal orifice and                            ileocecal valve. The ileocecal valve, appendiceal                            orifice, and rectum were photographed. The quality                            of the bowel preparation was excellent. The                            colonoscopy was performed without difficulty. The                            patient tolerated the procedure well. The bowel                            preparation used was SUPREP via split dose                            instruction. Scope In: 12:08:44 PM Scope Out: 12:24:39 PM Scope Withdrawal Time: 0 hours 13 minutes 56 seconds  Total Procedure Duration: 0 hours 15 minutes 55 seconds  Findings:                 Three polyps were found in the hepatic flexure and                            ascending colon. The polyps were 2 to 4 mm in size.                            These polyps were removed with a cold snare.                            Resection and retrieval were complete.  Three polyps were found in the transverse colon.                            The polyps were 1 to 2 mm in size. These polyps                            were removed with a jumbo cold forceps. Resection                            and retrieval were complete.                           A few scattered diverticula were found in the                            colon, including the cecum.                           The exam was otherwise without abnormality on                            direct and retroflexion views. Complications:            No immediate complications. Estimated blood loss:                            None. Estimated Blood Loss:     Estimated blood loss: none. Impression:               - Three 2 to 4 mm polyps at the hepatic flexure and                            in the ascending colon, removed with a cold snare.                             Resected and retrieved.                           - Three 1 to 2 mm polyps in the transverse colon,                            removed with a jumbo cold forceps. Resected and                            retrieved.                           - Diverticulosis                           - The examination was otherwise normal on direct                            and retroflexion views. Recommendation:           - Repeat colonoscopy in 3  years for surveillance.                           - Patient has a contact number available for                            emergencies. The signs and symptoms of potential                            delayed complications were discussed with the                            patient. Return to normal activities tomorrow.                            Written discharge instructions were provided to the                            patient.                           - Resume previous diet.                           - Continue present medications.                           - Await pathology results. Docia Chuck. Henrene Pastor, MD 02/27/2023 12:34:32 PM This report has been signed electronically.

## 2023-02-27 NOTE — Progress Notes (Signed)
Pt. states no medical or surgical changes since previsit or office visit. 

## 2023-02-28 ENCOUNTER — Telehealth: Payer: Self-pay | Admitting: *Deleted

## 2023-02-28 NOTE — Telephone Encounter (Signed)
Post procedure follow up phone call. No answer at number given.  Left message on voicemail.  

## 2023-03-05 ENCOUNTER — Encounter: Payer: Self-pay | Admitting: Internal Medicine

## 2023-03-13 DIAGNOSIS — H35363 Drusen (degenerative) of macula, bilateral: Secondary | ICD-10-CM | POA: Diagnosis not present

## 2023-03-13 DIAGNOSIS — H524 Presbyopia: Secondary | ICD-10-CM | POA: Diagnosis not present

## 2023-04-07 ENCOUNTER — Other Ambulatory Visit: Payer: Self-pay

## 2023-04-07 ENCOUNTER — Emergency Department (HOSPITAL_BASED_OUTPATIENT_CLINIC_OR_DEPARTMENT_OTHER)
Admission: EM | Admit: 2023-04-07 | Discharge: 2023-04-07 | Disposition: A | Discharge status: Home / Self Care | Payer: Medicare HMO | Attending: Emergency Medicine | Admitting: Emergency Medicine

## 2023-04-07 ENCOUNTER — Emergency Department (HOSPITAL_BASED_OUTPATIENT_CLINIC_OR_DEPARTMENT_OTHER): Payer: Medicare HMO | Admitting: Radiology

## 2023-04-07 ENCOUNTER — Encounter (HOSPITAL_BASED_OUTPATIENT_CLINIC_OR_DEPARTMENT_OTHER): Payer: Self-pay

## 2023-04-07 DIAGNOSIS — R509 Fever, unspecified: Secondary | ICD-10-CM | POA: Diagnosis not present

## 2023-04-07 DIAGNOSIS — R059 Cough, unspecified: Secondary | ICD-10-CM | POA: Diagnosis not present

## 2023-04-07 DIAGNOSIS — J9811 Atelectasis: Secondary | ICD-10-CM | POA: Diagnosis not present

## 2023-04-07 DIAGNOSIS — J209 Acute bronchitis, unspecified: Secondary | ICD-10-CM | POA: Diagnosis not present

## 2023-04-07 DIAGNOSIS — R0602 Shortness of breath: Secondary | ICD-10-CM | POA: Insufficient documentation

## 2023-04-07 DIAGNOSIS — Z7982 Long term (current) use of aspirin: Secondary | ICD-10-CM | POA: Insufficient documentation

## 2023-04-07 DIAGNOSIS — Z20822 Contact with and (suspected) exposure to covid-19: Secondary | ICD-10-CM | POA: Insufficient documentation

## 2023-04-07 LAB — CBC WITH DIFFERENTIAL/PLATELET
Abs Immature Granulocytes: 0.03 10*3/uL (ref 0.00–0.07)
Basophils Absolute: 0 10*3/uL (ref 0.0–0.1)
Basophils Relative: 1 %
Eosinophils Absolute: 0.1 10*3/uL (ref 0.0–0.5)
Eosinophils Relative: 2 %
HCT: 40.2 % (ref 39.0–52.0)
Hemoglobin: 13.8 g/dL (ref 13.0–17.0)
Immature Granulocytes: 1 %
Lymphocytes Relative: 27 %
Lymphs Abs: 1.5 10*3/uL (ref 0.7–4.0)
MCH: 31.7 pg (ref 26.0–34.0)
MCHC: 34.3 g/dL (ref 30.0–36.0)
MCV: 92.4 fL (ref 80.0–100.0)
Monocytes Absolute: 0.7 10*3/uL (ref 0.1–1.0)
Monocytes Relative: 12 %
Neutro Abs: 3.3 10*3/uL (ref 1.7–7.7)
Neutrophils Relative %: 57 %
Platelets: 209 10*3/uL (ref 150–400)
RBC: 4.35 MIL/uL (ref 4.22–5.81)
RDW: 12.5 % (ref 11.5–15.5)
WBC: 5.7 10*3/uL (ref 4.0–10.5)
nRBC: 0 % (ref 0.0–0.2)

## 2023-04-07 LAB — RESP PANEL BY RT-PCR (RSV, FLU A&B, COVID)  RVPGX2
Influenza A by PCR: NEGATIVE
Influenza B by PCR: NEGATIVE
Resp Syncytial Virus by PCR: NEGATIVE
SARS Coronavirus 2 by RT PCR: NEGATIVE

## 2023-04-07 LAB — BRAIN NATRIURETIC PEPTIDE: B Natriuretic Peptide: 18.9 pg/mL (ref 0.0–100.0)

## 2023-04-07 LAB — COMPREHENSIVE METABOLIC PANEL
ALT: 35 U/L (ref 0–44)
AST: 34 U/L (ref 15–41)
Albumin: 4.1 g/dL (ref 3.5–5.0)
Alkaline Phosphatase: 45 U/L (ref 38–126)
Anion gap: 7 (ref 5–15)
BUN: 15 mg/dL (ref 8–23)
CO2: 28 mmol/L (ref 22–32)
Calcium: 9 mg/dL (ref 8.9–10.3)
Chloride: 103 mmol/L (ref 98–111)
Creatinine, Ser: 0.86 mg/dL (ref 0.61–1.24)
GFR, Estimated: >60 mL/min (ref >60)
Glucose, Bld: 107 mg/dL — ABNORMAL HIGH (ref 70–99)
Potassium: 4.3 mmol/L (ref 3.5–5.1)
Sodium: 138 mmol/L (ref 135–145)
Total Bilirubin: 0.5 mg/dL (ref 0.3–1.2)
Total Protein: 6.8 g/dL (ref 6.5–8.1)

## 2023-04-07 LAB — TROPONIN I (HIGH SENSITIVITY): Troponin I (High Sensitivity): 5 ng/L (ref <18)

## 2023-04-07 MED ORDER — DOXYCYCLINE HYCLATE 100 MG PO CAPS: 100.0000 mg | ORAL_CAPSULE | Freq: Two times a day (BID) | ORAL | 0 refills | Status: DC

## 2023-04-07 MED ORDER — DOXYCYCLINE HYCLATE 100 MG PO TABS
100.0000 mg | ORAL_TABLET | Freq: Once | ORAL | Status: AC
  Administered 2023-04-07: 100 mg via ORAL
  Filled 2023-04-07: qty 1

## 2023-04-07 MED ORDER — GUAIFENESIN-CODEINE 100-10 MG/5ML PO SOLN
10.0000 mL | Freq: Once | ORAL | Status: AC
  Administered 2023-04-07: 10 mL via ORAL
  Filled 2023-04-07: qty 10

## 2023-04-07 MED ORDER — ALBUTEROL SULFATE HFA 108 (90 BASE) MCG/ACT IN AERS: 2.0000 | INHALATION_SPRAY | RESPIRATORY_TRACT | Status: DC | PRN

## 2023-04-07 MED ORDER — GUAIFENESIN-CODEINE 100-10 MG/5ML PO SOLN: 10.0000 mL | Freq: Four times a day (QID) | ORAL | 0 refills | Status: DC | PRN

## 2023-04-07 NOTE — ED Provider Notes (Signed)
[place] EMERGENCY DEPARTMENT AT [facility] Provider Note   CSN: [id number] Arrival date & time: 04/07/23  4098     History  No chief complaint on file.   Victor Zamora is a 72 y.o. male.  Patient is a 72 year old male with past medical history of hyperlipidemia, prediabetes.  Patient presenting today for evaluation of fever, cough, and shortness of breath.  This been ongoing for the past 4 days.  He recently returned home from a cruise to [place].  He says his wife was diagnosed with COVID during the trip.  Patient has tested himself at home, but has been negative.  He describes coughing fits that make it difficult for him to catch his breath.  He has had low-grade fever.  No aggravating or alleviating factors.  He denies any leg pain or swelling.  The history is provided by the patient.       Home Medications Prior to Admission medications   Medication Sig Start Date End Date Taking? Authorizing Provider  aspirin EC (ASPIR-LOW) 81 MG tablet 1 tablet    [provider]  Biotin 11914 MCG TABS 1 tablet    [provider]  cholecalciferol (VITAMIN D3) 25 MCG (1000 UNIT) tablet 1 tablet    [provider]  cyanocobalamin (VITAMIN B12) 100 MCG tablet See admin instructions.    [provider]  rosuvastatin (CRESTOR) 10 MG tablet Take 1 tablet (10 mg total) by mouth at bedtime. 07/26/22   Nafziger, Kandee Keen, NP      Allergies    Clarithromycin and Penicillins    Review of Systems   Review of Systems  All other systems reviewed and are negative.   Physical Exam Updated Vital Signs BP 122/75 (BP Location: Right Arm)   Pulse 87   Temp 98.4 F (36.9 C) (Oral)   Resp 20   Ht 5\' 11"  (1.803 m)   Wt 106.6 kg   SpO2 98%   BMI 32.78 kg/m  Physical Exam Vitals and nursing note reviewed.  Constitutional:      General: He is not in acute distress.    Appearance: He is well-developed. He is not diaphoretic.  HENT:     Head: Normocephalic  and atraumatic.  Cardiovascular:     Rate and Rhythm: Normal rate and regular rhythm.     Heart sounds: No murmur heard.    No friction rub.  Pulmonary:     Effort: Pulmonary effort is normal. No respiratory distress.     Breath sounds: Normal breath sounds. No wheezing or rales.  Abdominal:     General: Bowel sounds are normal. There is no distension.     Palpations: Abdomen is soft.     Tenderness: There is no abdominal tenderness.  Musculoskeletal:        General: Normal range of motion.     Cervical back: Normal range of motion and neck supple.  Skin:    General: Skin is warm and dry.  Neurological:     Mental Status: He is alert and oriented to person, place, and time.     Coordination: Coordination normal.     ED Results / Procedures / Treatments   Labs (all labs ordered are listed, but only abnormal results are displayed) Labs Reviewed  RESP PANEL BY RT-PCR (RSV, FLU A&B, COVID)  [id number]  COMPREHENSIVE METABOLIC PANEL  CBC WITH DIFFERENTIAL/PLATELET  BRAIN NATRIURETIC PEPTIDE  TROPONIN I (HIGH SENSITIVITY)    EKG EKG Interpretation  Date/Time:  Monday Apr 07 2023 02:43:16 EDT Ventricular Rate:  80 PR Interval:  146 QRS Duration: 86 QT Interval:  386 QTC Calculation: 446 R Axis:   45 Text Interpretation: Sinus rhythm Baseline wander in lead(s) V1 Normal ECG Confirmed by Geoffery Lyons ([id number]) on 04/07/2023 2:53:21 AM  Radiology No results found.  Procedures Procedures    Medications Ordered in ED Medications  albuterol (VENTOLIN HFA) 108 (90 Base) MCG/ACT inhaler 2 puff (has no administration in time range)    ED Course/ Medical Decision Making/ A&P  Patient is a 72 year old male presenting with complaints of low-grade fever and cough worsening over the past 4 to 5 days.  Patient arrives here with stable vital signs and is afebrile.  Physical examination reveals clear lungs and is otherwise unremarkable.  Workup initiated including CBC, metabolic  panel, troponin, and BNP.  All studies unremarkable.  COVID/flu/RSV all negative.  Chest x-ray obtained and is negative for pneumonia or other acute process.  Patient symptoms most likely related to a bronchitis, whether viral or bacterial I am uncertain.  I will prescribe doxycycline, Robitussin with codeine, and have patient follow-up with primary doctor if not improving.  Final Clinical Impression(s) / ED Diagnoses Final diagnoses:  None    Rx / DC Orders ED Discharge Orders     None         Geoffery Lyons, MD 04/07/23 [phone]

## 2023-04-07 NOTE — ED Triage Notes (Signed)
Pt was traveling to [place] and his wife tested positive for COVID. Pt has been testing negative, but he has been having intermittent fevers, diarrhea, cough, and SOB.

## 2023-04-07 NOTE — ED Notes (Signed)
RT assessed pt for SOB. Pt respiratory status stable on RA w/no distress noted at this time. Pts BLBS clr/dim.

## 2023-04-07 NOTE — Discharge Instructions (Signed)
Begin taking doxycycline as prescribed.  Begin taking Robitussin with codeine as prescribed as needed for cough.  Follow-up with primary doctor if symptoms are not improving in the next 3 to 4 days.

## 2023-04-08 ENCOUNTER — Ambulatory Visit (INDEPENDENT_AMBULATORY_CARE_PROVIDER_SITE_OTHER): Payer: Medicare HMO | Admitting: Adult Health

## 2023-04-08 VITALS — BP 120/78 | HR 95 | Temp 98.8°F | Ht 71.0 in | Wt 241.0 lb

## 2023-04-08 DIAGNOSIS — J40 Bronchitis, not specified as acute or chronic: Secondary | ICD-10-CM | POA: Diagnosis not present

## 2023-04-08 MED ORDER — PREDNISONE 10 MG PO TABS
ORAL_TABLET | ORAL | 0 refills | Status: DC
Start: 2023-04-08 — End: 2023-05-09

## 2023-04-08 MED ORDER — BENZONATATE 200 MG PO CAPS
200.0000 mg | ORAL_CAPSULE | Freq: Two times a day (BID) | ORAL | 1 refills | Status: DC | PRN
Start: 2023-04-08 — End: 2023-05-09

## 2023-04-08 NOTE — Progress Notes (Signed)
Subjective:    Patient ID: Victor Zamora, male    DOB: 11-21-1951, 72 y.o.   MRN: [id number]  HPI 72 year old male who  has a past medical history of Cataract, Colon polyps, History of skin cancer, Hyperlipidemia, and Pre-diabetes.  He was seen in the emergency room yesterday for evaluation of fever, cough, shortness of breath.  This has been ongoing for 4 days.  He reported that his wife had returned from a cruise in [place] and his wife was diagnosed with COVID during the trip.  He had been testing himself at home but has been negative.  In the ER he reported that the coughing fits made it difficult for him to catch his breath.  In the ER he was afebrile, physical exam revealed clear lungs and was otherwise unremarkable.  CBC, CMP, troponin, BMP were negative.  COVID/flu/RSV were negative.  And a chest x-ray which was negative for pneumonia or other acute process.  Thought his symptoms were likely related to bronchitis.  He was prescribed doxycycline Robitussin with codeine  He reports today that Robitussin causes worsening cough  He reports that this that this seems to be an issue whenever he travels or gets a " cold".  He does not have any history of asthma.    Review of Systems See HPI   Past Medical History:  Diagnosis Date   Cataract    Colon polyps    History of skin cancer    Hyperlipidemia    Pre-diabetes     Social History   Socioeconomic History   Marital status: Married    Spouse name: Not on file   Number of children: Not on file   Years of education: Not on file   Highest education level: Master's degree (e.g., MA, MS, MEng, MEd, MSW, MBA)  Occupational History   Not on file  Tobacco Use   Smoking status: Never   Smokeless tobacco: Never  Vaping Use   Vaping Use: Never used  Substance and Sexual Activity   Alcohol use: Yes    Alcohol/week: 7.0 standard drinks of alcohol    Types: 2 Glasses of wine, 3 Cans of beer, 2 Shots of liquor per week   Drug  use: No   Sexual activity: Never  Other Topics Concern   Not on file  Social History Narrative   Not on file   Social Determinants of Health   Financial Resource Strain: Low Risk  (04/08/2023)   Overall Financial Resource Strain (CARDIA)    Difficulty of Paying Living Expenses: Not hard at all  Food Insecurity: No Food Insecurity (04/08/2023)   Hunger Vital Sign    Worried About Running Out of Food in the Last Year: Never true    Ran Out of Food in the Last Year: Never true  Transportation Needs: No Transportation Needs (04/08/2023)   PRAPARE - Administrator, Civil Service (Medical): No    Lack of Transportation (Non-Medical): No  Physical Activity: Insufficiently Active (04/08/2023)   Exercise Vital Sign    Days of Exercise per Week: 3 days    Minutes of Exercise per Session: 40 min  Stress: No Stress Concern Present (04/08/2023)   [employer] of Occupational Health - Occupational Stress Questionnaire    Feeling of Stress : Not at all  Social Connections: Moderately Isolated (04/08/2023)   Social Connection and Isolation Panel [NHANES]    Frequency of Communication with Friends and Family: Once a week  Frequency of Social Gatherings with Friends and Family: Once a week    Attends Religious Services: Never    Database administrator or Organizations: No    Attends Engineer, structural: More than 4 times per year    Marital Status: Married  Catering manager Violence: Not At Risk (12/06/2022)   Humiliation, Afraid, Rape, and Kick questionnaire    Fear of Current or Ex-Partner: No    Emotionally Abused: No    Physically Abused: No    Sexually Abused: No    Past Surgical History:  Procedure Laterality Date   appendectomy  1969   Cataract Bilateral 12/2010   COLONOSCOPY     eye lid surgery   2019    Family History  Problem Relation Age of Onset   Cancer Mother    Pancreatic cancer Father    Colon cancer Neg Hx    Colon polyps Neg Hx     Esophageal cancer Neg Hx    Rectal cancer Neg Hx    Stomach cancer Neg Hx     Allergies  Allergen Reactions   Clarithromycin Other (See Comments)   Penicillins     Current Outpatient Medications on File Prior to Visit  Medication Sig Dispense Refill   aspirin EC (ASPIR-LOW) 81 MG tablet 1 tablet     Biotin 16109 MCG TABS 1 tablet     cholecalciferol (VITAMIN D3) 25 MCG (1000 UNIT) tablet 1 tablet     cyanocobalamin (VITAMIN B12) 100 MCG tablet See admin instructions.     doxycycline (VIBRAMYCIN) 100 MG capsule Take 1 capsule (100 mg total) by mouth 2 (two) times daily. One po bid x 7 days 14 capsule 0   guaiFENesin-codeine 100-10 MG/5ML syrup Take 10 mLs by mouth every 6 (six) hours as needed for cough. 120 mL 0   rosuvastatin (CRESTOR) 10 MG tablet Take 1 tablet (10 mg total) by mouth at bedtime. 90 tablet 3   No current facility-administered medications on file prior to visit.    BP 120/78   Pulse 95   Temp 98.8 F (37.1 C) (Oral)   Ht 5\' 11"  (1.803 m)   Wt 241 lb (109.3 kg)   SpO2 95%   BMI 33.61 kg/m       Objective:   Physical Exam Vitals and nursing note reviewed.  Constitutional:      Appearance: Normal appearance.  Cardiovascular:     Rate and Rhythm: Normal rate and regular rhythm.     Pulses: Normal pulses.     Heart sounds: Normal heart sounds.  Pulmonary:     Breath sounds: No decreased air movement. Examination of the left-lower field reveals wheezing. Wheezing (resolved with cough) present.  Musculoskeletal:        General: Normal range of motion.  Skin:    General: Skin is warm and dry.  Neurological:     General: No focal deficit present.     Mental Status: He is alert.  Psychiatric:        Mood and Affect: Mood normal.        Behavior: Behavior normal.        Thought Content: Thought content normal.        Assessment & Plan:   1. Bronchitis - Will send in Prednisone and tessalon pearls  Also refer to pulmonary due to recurrent nature.   - predniSONE (DELTASONE) 10 MG tablet; 40 mg x 3 days, 20 mg x 3 days, 10 mg x 3 days  Dispense: 21 tablet; Refill: 0 - Ambulatory referral to Pulmonology - benzonatate (TESSALON) 200 MG capsule; Take 1 capsule (200 mg total) by mouth 2 (two) times daily as needed for cough.  Dispense: 20 capsule; Refill: 1  Shirline Frees, NP

## 2023-04-21 ENCOUNTER — Encounter: Payer: Self-pay | Admitting: Adult Health

## 2023-04-24 DIAGNOSIS — H35363 Drusen (degenerative) of macula, bilateral: Secondary | ICD-10-CM | POA: Diagnosis not present

## 2023-04-28 ENCOUNTER — Encounter: Payer: Self-pay | Admitting: Adult Health

## 2023-05-09 ENCOUNTER — Encounter: Payer: Self-pay | Admitting: Pulmonary Disease

## 2023-05-09 ENCOUNTER — Ambulatory Visit (INDEPENDENT_AMBULATORY_CARE_PROVIDER_SITE_OTHER): Payer: Medicare HMO | Admitting: Pulmonary Disease

## 2023-05-09 VITALS — BP 112/70 | HR 83 | Temp 97.9°F | Ht 72.0 in | Wt 233.2 lb

## 2023-05-09 DIAGNOSIS — R053 Chronic cough: Secondary | ICD-10-CM

## 2023-05-09 MED ORDER — PREDNISONE 10 MG PO TABS: ORAL_TABLET | ORAL | 0 refills | Status: AC

## 2023-05-09 NOTE — Patient Instructions (Addendum)
Nice to meet you  Based on your recent symptoms and history suspicious for something like asthma.  The symptoms can come and go and often times the symptoms are irregular or not reliably the same day-to-day week.  Etc.  Often can cause bouts of prolonged bronchitis as well.  Take prednisone 40 mg daily for 5 days then 20 mg daily for 5 days then 10 mg daily for 5 days then stop  Try the inhaler Breztri 2 puffs in the morning and 2 puffs in the evening.  Rinse your mouth out with water thoroughly after every use.  The idea that prednisone would help in the short-term and an inhaler would help long-term to keep symptoms at bay.  Return to clinic in 2 months or sooner as needed with Dr. Judeth Horn

## 2023-05-09 NOTE — Progress Notes (Signed)
Cough, dyspnea exertion: Following viral illness likely COVID as his wife had COVID with similar symptoms although he tested negative.  @Patient  ID: Victor Zamora, male    DOB: [birth year], 72 y.o.   MRN: [id number]  Chief Complaint  Patient presents with  . Consult    CXR 04/07/23 SOB with talking and coughing     Referring provider: Shirline Frees, NP  HPI:   PMH:  Smoker/ Smoking History:  Maintenance:   Pt of:   05/09/2023  - Visit     Questionaires / Pulmonary Flowsheets:   ACT:      No data to display          MMRC:     No data to display          Epworth:      No data to display          Tests:   FENO:  No results found for: "NITRICOXIDE"  PFT:     No data to display          WALK:      No data to display          Imaging: No results found.  Lab Results:  CBC    Component Value Date/Time   WBC 5.7 04/07/2023 0304   RBC 4.35 04/07/2023 0304   HGB 13.8 04/07/2023 0304   HCT 40.2 04/07/2023 0304   PLT 209 04/07/2023 0304   MCV 92.4 04/07/2023 0304   MCH 31.7 04/07/2023 0304   MCHC 34.3 04/07/2023 0304   RDW 12.5 04/07/2023 0304   LYMPHSABS 1.5 04/07/2023 0304   MONOABS 0.7 04/07/2023 0304   EOSABS 0.1 04/07/2023 0304   BASOSABS 0.0 04/07/2023 0304    BMET    Component Value Date/Time   NA 138 04/07/2023 0304   K 4.3 04/07/2023 0304   CL 103 04/07/2023 0304   CO2 28 04/07/2023 0304   GLUCOSE 107 (H) 04/07/2023 0304   BUN 15 04/07/2023 0304   CREATININE 0.86 04/07/2023 0304   CALCIUM 9.0 04/07/2023 0304   GFRNONAA >60 04/07/2023 0304    BNP    Component Value Date/Time   BNP 18.9 04/07/2023 0304    ProBNP No results found for: "PROBNP"  Specialty Problems   None   Allergies  Allergen Reactions  . Clarithromycin Other (See Comments)  . Penicillins     Immunization History  Administered Date(s) Administered  . Fluad Quad(high Dose 65+) 10/02/2022  . Influenza Split 10/11/2018, 08/26/2019,  10/27/2021  . PFIZER(Purple Top)SARS-COV-2 Vaccination 12/25/2019, 01/15/2020, 10/24/2020, 03/01/2021  . PNEUMOCOCCAL CONJUGATE-20 07/13/2022  . Respiratory Syncytial Virus Vaccine,Recomb Aduvanted(Arexvy) 07/25/2022  . Tdap 10/29/2013  . Zoster Recombinat (Shingrix) 01/14/2023, 04/18/2023    Past Medical History:  Diagnosis Date  . Cataract   . Colon polyps   . History of skin cancer   . Hyperlipidemia   . Pre-diabetes     Tobacco History: Social History   Tobacco Use  Smoking Status Never  Smokeless Tobacco Never   Counseling given: Not Answered   Continue to not smoke  Outpatient Encounter Medications as of 05/09/2023  Medication Sig  . aspirin EC (ASPIR-LOW) 81 MG tablet 1 tablet  . Biotin 40981 MCG TABS 1 tablet  . cholecalciferol (VITAMIN D3) 25 MCG (1000 UNIT) tablet 1 tablet  . cyanocobalamin (VITAMIN B12) 100 MCG tablet See admin instructions.  . rosuvastatin (CRESTOR) 10 MG tablet Take 1 tablet (10 mg total) by mouth at bedtime.  . [DISCONTINUED] benzonatate (  TESSALON) 200 MG capsule Take 1 capsule (200 mg total) by mouth 2 (two) times daily as needed for cough.  . [DISCONTINUED] doxycycline (VIBRAMYCIN) 100 MG capsule Take 1 capsule (100 mg total) by mouth 2 (two) times daily. One po bid x 7 days  . [DISCONTINUED] guaiFENesin-codeine 100-10 MG/5ML syrup Take 10 mLs by mouth every 6 (six) hours as needed for cough.  . [DISCONTINUED] predniSONE (DELTASONE) 10 MG tablet 40 mg x 3 days, 20 mg x 3 days, 10 mg x 3 days   No facility-administered encounter medications on file as of 05/09/2023.     Review of Systems  Review of Systems   Physical Exam  There were no vitals taken for this visit.  Wt Readings from Last 5 Encounters:  04/08/23 241 lb (109.3 kg)  04/07/23 235 lb (106.6 kg)  02/27/23 238 lb 9.6 oz (108.2 kg)  01/28/23 236 lb (107 kg)  01/09/23 240 lb (108.9 kg)    BMI Readings from Last 5 Encounters:  04/08/23 33.61 kg/m  04/07/23 32.78  kg/m  02/27/23 33.28 kg/m  01/28/23 32.92 kg/m  01/09/23 33.47 kg/m     Physical Exam    Assessment & Plan:   Cough, dyspnea exertion: Following viral illness likely COVID as his wife had tested positive despite him testing negative.  Chest x-ray clear 03/2023.  High suspicion for development of asthma   No follow-ups on file.   Karren Burly, MD 05/09/2023   This appointment required *** minutes of patient care (this includes precharting, chart review, review of results, face-to-face care, etc.).

## 2023-05-15 DIAGNOSIS — L820 Inflamed seborrheic keratosis: Secondary | ICD-10-CM | POA: Diagnosis not present

## 2023-05-15 DIAGNOSIS — L570 Actinic keratosis: Secondary | ICD-10-CM | POA: Diagnosis not present

## 2023-05-15 DIAGNOSIS — L821 Other seborrheic keratosis: Secondary | ICD-10-CM | POA: Diagnosis not present

## 2023-05-15 DIAGNOSIS — L814 Other melanin hyperpigmentation: Secondary | ICD-10-CM | POA: Diagnosis not present

## 2023-05-15 DIAGNOSIS — L720 Epidermal cyst: Secondary | ICD-10-CM | POA: Diagnosis not present

## 2023-05-15 DIAGNOSIS — C44229 Squamous cell carcinoma of skin of left ear and external auricular canal: Secondary | ICD-10-CM | POA: Diagnosis not present

## 2023-05-15 DIAGNOSIS — D485 Neoplasm of uncertain behavior of skin: Secondary | ICD-10-CM | POA: Diagnosis not present

## 2023-05-15 DIAGNOSIS — L905 Scar conditions and fibrosis of skin: Secondary | ICD-10-CM | POA: Diagnosis not present

## 2023-05-15 DIAGNOSIS — D2361 Other benign neoplasm of skin of right upper limb, including shoulder: Secondary | ICD-10-CM | POA: Diagnosis not present

## 2023-05-15 DIAGNOSIS — L565 Disseminated superficial actinic porokeratosis (DSAP): Secondary | ICD-10-CM | POA: Diagnosis not present

## 2023-05-15 DIAGNOSIS — Z85828 Personal history of other malignant neoplasm of skin: Secondary | ICD-10-CM | POA: Diagnosis not present

## 2023-05-15 DIAGNOSIS — D225 Melanocytic nevi of trunk: Secondary | ICD-10-CM | POA: Diagnosis not present

## 2023-06-02 ENCOUNTER — Encounter: Payer: Self-pay | Admitting: Pulmonary Disease

## 2023-06-03 MED ORDER — BREZTRI AEROSPHERE 160-9-4.8 MCG/ACT IN AERO: 2.0000 | INHALATION_SPRAY | Freq: Two times a day (BID) | RESPIRATORY_TRACT | 0 refills | Status: DC

## 2023-06-09 DIAGNOSIS — C44229 Squamous cell carcinoma of skin of left ear and external auricular canal: Secondary | ICD-10-CM | POA: Diagnosis not present

## 2023-07-21 ENCOUNTER — Ambulatory Visit: Payer: Medicare HMO | Admitting: Pulmonary Disease

## 2023-07-25 ENCOUNTER — Other Ambulatory Visit: Payer: Self-pay | Admitting: Medical Genetics

## 2023-07-25 DIAGNOSIS — Z006 Encounter for examination for normal comparison and control in clinical research program: Secondary | ICD-10-CM

## 2023-07-31 ENCOUNTER — Other Ambulatory Visit (HOSPITAL_COMMUNITY)
Admission: RE | Admit: 2023-07-31 | Discharge: 2023-07-31 | Disposition: A | Discharge status: Home / Self Care | Payer: Medicare HMO | Attending: Oncology | Admitting: Oncology

## 2023-07-31 DIAGNOSIS — Z006 Encounter for examination for normal comparison and control in clinical research program: Secondary | ICD-10-CM | POA: Insufficient documentation

## 2023-08-12 LAB — GENECONNECT MOLECULAR SCREEN: Genetic Analysis Overall Interpretation: NEGATIVE

## 2023-09-19 ENCOUNTER — Other Ambulatory Visit: Payer: Self-pay | Admitting: Adult Health

## 2023-10-10 ENCOUNTER — Telehealth (INDEPENDENT_AMBULATORY_CARE_PROVIDER_SITE_OTHER): Payer: Medicare HMO | Admitting: Pulmonary Disease

## 2023-10-10 ENCOUNTER — Encounter: Payer: Self-pay | Admitting: Pulmonary Disease

## 2023-10-10 DIAGNOSIS — R053 Chronic cough: Secondary | ICD-10-CM

## 2023-10-10 NOTE — Progress Notes (Signed)
@Patient  ID: Victor Zamora, male    DOB: 11/18/1951, 72 y.o.   MRN: [id number]  No chief complaint on file. Chief complaint: Cough  Referring provider: Shirline Frees, NP  HPI:   72 y.o. man whom we are seeing for evaluation of cough and shortness of breath, bronchitis-like symptoms.    Still with irritating cough.  Largely dry.  Feels like there is irritation in the back of his throat that comes and goes.  Denies GERD symptoms denies reflux.  Denies significant postnasal drip.  Tried Breztri.  May help a little bit.  Was given prednisone taper at last visit.  He states that helped at all.  Simply did not help.  Not even for short period of time.  Discussed likely upper airway cough syndrome.  Vicious cycle.  Hard to treat.  Discussed ready for strict cough avoidance at first.  Consider referral to ENT, voice center, at [facility] if not improving.  HPI at initial visit: Patient returned from vacation.  Victor November tested positive for COVID.  He had similar a little bit milder symptoms.  Has undergone multiple times with negative.  Since then he has complained of cough.  Some shortness of breath dyspnea on exertion.  When things are better or worse.  No position make things better or worse.  No seasonal or environmental factors he can identify to make things better or worse.  Had a short course of  steroids without improvement.  Antibiotics have not helped.  No other alleviating or exacerbating factors.  He states in the past similar symptoms have occurred.  Bronchitis-like symptoms.  You usually get better.  The symptoms lasted now for months.  Unusual.  Presented to the ED 5/13, chest x-ray obtained at that time on my review interpretation reveals clear lungs bilaterally.  PMH: Hyperlipidemia, GERD Surgical history: Appendectomy, cataract surgery Family history: With history of pancreatic cancer Social history: Never smoker, lives in [place] / Pulmonary Flowsheets:    ACT:      No data to display          MMRC:     No data to display          Epworth:      No data to display          Tests:   FENO:  No results found for: "NITRICOXIDE"  PFT:     No data to display          WALK:      No data to display          Imaging: Personally reviewed and as per EMR and discussion in this note No results found.  Lab Results: Personally reviewed CBC    Component Value Date/Time   WBC 5.7 04/07/2023 0304   RBC 4.35 04/07/2023 0304   HGB 13.8 04/07/2023 0304   HCT 40.2 04/07/2023 0304   PLT 209 04/07/2023 0304   MCV 92.4 04/07/2023 0304   MCH 31.7 04/07/2023 0304   MCHC 34.3 04/07/2023 0304   RDW 12.5 04/07/2023 0304   LYMPHSABS 1.5 04/07/2023 0304   MONOABS 0.7 04/07/2023 0304   EOSABS 0.1 04/07/2023 0304   BASOSABS 0.0 04/07/2023 0304    BMET    Component Value Date/Time   NA 138 04/07/2023 0304   K 4.3 04/07/2023 0304   CL 103 04/07/2023 0304   CO2 28 04/07/2023 0304   GLUCOSE 107 (H) 04/07/2023 0304   BUN 15 04/07/2023 0304  CREATININE 0.86 04/07/2023 0304   CALCIUM 9.0 04/07/2023 0304   GFRNONAA >60 04/07/2023 0304    BNP    Component Value Date/Time   BNP 18.9 04/07/2023 0304    ProBNP No results found for: "PROBNP"  Specialty Problems   None   Allergies  Allergen Reactions   Clarithromycin Other (See Comments)   Penicillins     Immunization History  Administered Date(s) Administered   Fluad Quad(high Dose 65+) 10/02/2022   Influenza Split 10/11/2018, 08/26/2019, 10/27/2021   PFIZER(Purple Top)SARS-COV-2 Vaccination 12/25/2019, 01/15/2020, 10/24/2020, 03/01/2021   PNEUMOCOCCAL CONJUGATE-20 07/13/2022   Respiratory Syncytial Virus Vaccine,Recomb Aduvanted(Arexvy) 07/25/2022   Tdap 10/29/2013   Zoster Recombinant(Shingrix) 01/14/2023, 04/18/2023    Past Medical History:  Diagnosis Date   Cataract    Colon polyps    History of skin cancer    Hyperlipidemia     Pre-diabetes     Tobacco History: Social History   Tobacco Use  Smoking Status Never  Smokeless Tobacco Never   Counseling given: Not Answered   Continue to not smoke  Outpatient Encounter Medications as of 10/10/2023  Medication Sig   aspirin EC (ASPIR-LOW) 81 MG tablet 1 tablet   Biotin 01027 MCG TABS 1 tablet   Budeson-Glycopyrrol-Formoterol (BREZTRI AEROSPHERE) 160-9-4.8 MCG/ACT AERO Inhale 2 puffs into the lungs in the morning and at bedtime.   cholecalciferol (VITAMIN D3) 25 MCG (1000 UNIT) tablet 1 tablet   cyanocobalamin (VITAMIN B12) 100 MCG tablet See admin instructions.   esomeprazole (NEXIUM) 20 MG packet Take 20 mg by mouth daily before breakfast.   rosuvastatin (CRESTOR) 10 MG tablet TAKE 1 TABLET AT BEDTIME   No facility-administered encounter medications on file as of 10/10/2023.     Review of Systems  Review of Systems  No chest pain with exertion.  No orthopnea or PND.  Comprehensive review of systems otherwise negative. Physical Exam  There were no vitals taken for this visit.  Wt Readings from Last 5 Encounters:  05/09/23 233 lb 3.2 oz (105.8 kg)  04/08/23 241 lb (109.3 kg)  04/07/23 235 lb (106.6 kg)  02/27/23 238 lb 9.6 oz (108.2 kg)  01/28/23 236 lb (107 kg)    BMI Readings from Last 5 Encounters:  05/09/23 31.63 kg/m  04/08/23 33.61 kg/m  04/07/23 32.78 kg/m  02/27/23 33.28 kg/m  01/28/23 32.92 kg/m     Physical Exam General: Sitting upright, no acute distress Eyes: EOMI, no icterus Neck: Range of motion appears intact Pulmonary: Normal work of breathing, on room air Neuro: No focal deficits noted Psych: Normal mood, full affect  Assessment & Plan:   Cough, dyspnea on exertion: Following viral illness likely COVID as his wife had tested positive despite him testing negative.  Regardless, likely postviral syndrome.  Chest x-ray clear 03/2023.  High suspicion for development of asthma especially with history of variable nature  of symptoms.  Breztri 2 puffs twice daily with minimal to mild improvement.  Prednisone did not help.  Denies GERD and postnasal drip.  High suspicion for upper airway cough syndrome.  Strict cough avoidance with sugar-free lozenges, avoiding menthol and mint, sips of water when irritated.  Strict cough avoidance.  See if this helps.  If not, would recommend referral to voice center at [facility].   Return if symptoms worsen or fail to improve, for f/u Dr. Judeth Horn.   Karren Burly, MD 10/10/2023   Virtual Visit via Video Note  I connected with Victor Zamora on 10/10/23 at  2:00 PM EST by a video enabled telemedicine application and verified that I am speaking with the correct person using two identifiers.  Location: Patient: Home Provider: Office - [facility] - [address] [place], [address], [place], [place] [address]  I discussed the limitations of evaluation and management by telemedicine and the availability of in person appointments. The patient expressed understanding and agreed to proceed. I also discussed with the patient that there may be a patient responsible charge related to this service. The patient expressed understanding and agreed to proceed.  Patient consented to consult via telephone: Yes People present and their role in pt care: Pt

## 2023-10-10 NOTE — Patient Instructions (Addendum)
Sugar free jolly ranchers when feeling irritated  Sips of water when feeling irritated  Try to avoid any coughing for at least a week and see if things get better  If not getting better would recommend referral to ENT doctors at [facility]

## 2023-11-09 ENCOUNTER — Encounter: Payer: Self-pay | Admitting: Pulmonary Disease

## 2023-11-09 DIAGNOSIS — R058 Other specified cough: Secondary | ICD-10-CM

## 2023-11-27 ENCOUNTER — Encounter: Payer: Self-pay | Admitting: Adult Health

## 2023-12-04 DIAGNOSIS — L814 Other melanin hyperpigmentation: Secondary | ICD-10-CM | POA: Diagnosis not present

## 2023-12-04 DIAGNOSIS — L570 Actinic keratosis: Secondary | ICD-10-CM | POA: Diagnosis not present

## 2023-12-04 DIAGNOSIS — D225 Melanocytic nevi of trunk: Secondary | ICD-10-CM | POA: Diagnosis not present

## 2023-12-04 DIAGNOSIS — L821 Other seborrheic keratosis: Secondary | ICD-10-CM | POA: Diagnosis not present

## 2023-12-04 DIAGNOSIS — D2361 Other benign neoplasm of skin of right upper limb, including shoulder: Secondary | ICD-10-CM | POA: Diagnosis not present

## 2023-12-04 DIAGNOSIS — Z85828 Personal history of other malignant neoplasm of skin: Secondary | ICD-10-CM | POA: Diagnosis not present

## 2023-12-06 NOTE — Telephone Encounter (Signed)
 Recent mychart sent by pt:  Norleen GORMAN [place] [facility] SHAUNNA [facility] (supporting Donnice JONELLE Beals, MD)5 days ago    [place] that you placed a referral back on 12/17. I've not heard anything from whoever you sent it to. Am I supposed to follow up with them? If so, can you provide me with the provider contact as well as sending a copy of the referral to my email: [email or web address] Thank you    Routing to [facility] for review. Please advise.

## 2023-12-17 ENCOUNTER — Encounter: Payer: Self-pay | Admitting: Adult Health

## 2023-12-18 ENCOUNTER — Ambulatory Visit (INDEPENDENT_AMBULATORY_CARE_PROVIDER_SITE_OTHER): Payer: Medicare HMO | Admitting: Adult Health

## 2023-12-18 ENCOUNTER — Encounter: Payer: Self-pay | Admitting: Adult Health

## 2023-12-18 VITALS — BP 126/70 | HR 73 | Temp 98.7°F | Ht 72.0 in | Wt 245.0 lb

## 2023-12-18 DIAGNOSIS — H8113 Benign paroxysmal vertigo, bilateral: Secondary | ICD-10-CM | POA: Diagnosis not present

## 2023-12-18 DIAGNOSIS — R413 Other amnesia: Secondary | ICD-10-CM

## 2023-12-18 NOTE — Progress Notes (Signed)
Subjective:    Patient ID: Victor Zamora, male    DOB: 1951-05-22, 73 y.o.   MRN: [id number]  Dizziness    73 year old male who  has a past medical history of Cataract, Colon polyps, History of skin cancer, Hyperlipidemia, and Pre-diabetes.  He presents to the office today for an acute issue.   He reports that back in July he had Mohs surgery on his left ear. Since that time ( about 6 months) he has been experiencing " dizzy spells". He thought this would be temporary but never it never went away. He reports that when he gets out of the chair and turns around, gets out of bed, or bends over to tie his shoes and stands up too quickly he will get dizzy. He feels like the world is spinning around him. This is not present every time but happens more then he would like. He has not had any falls or syncopal episodes.   Additionally he reports that for " awhile"  he has noticed that his short term memory has not been what it has in the past. At times when he goes out to run errands he will forget where he is going. His wife has also reported that when he is sitting in his chair and she looks over at him that he always seems to be zoned out.  He has not gotten lost while driving. He is not forgetting family members names.   His long term memory seems to be fine.   Review of Systems  Neurological:  Positive for dizziness.   See HPI   Past Medical History:  Diagnosis Date   Cataract    Colon polyps    History of skin cancer    Hyperlipidemia    Pre-diabetes     Social History   Socioeconomic History   Marital status: Married    Spouse name: Not on file   Number of children: Not on file   Years of education: Not on file   Highest education level: Bachelor's degree (e.g., BA, AB, BS)  Occupational History   Not on file  Tobacco Use   Smoking status: Never   Smokeless tobacco: Never  Vaping Use   Vaping status: Never Used  Substance and Sexual Activity   Alcohol use: Yes     Alcohol/week: 7.0 standard drinks of alcohol    Types: 2 Glasses of wine, 3 Cans of beer, 2 Shots of liquor per week   Drug use: No   Sexual activity: Never  Other Topics Concern   Not on file  Social History Narrative   Not on file   Social Drivers of Health   Financial Resource Strain: Low Risk  (12/17/2023)   Overall Financial Resource Strain (CARDIA)    Difficulty of Paying Living Expenses: Not hard at all  Food Insecurity: No Food Insecurity (12/17/2023)   Hunger Vital Sign    Worried About Running Out of Food in the Last Year: Never true    Ran Out of Food in the Last Year: Never true  Transportation Needs: No Transportation Needs (12/17/2023)   PRAPARE - Administrator, Civil Service (Medical): No    Lack of Transportation (Non-Medical): No  Physical Activity: Insufficiently Active (12/17/2023)   Exercise Vital Sign    Days of Exercise per Week: 1 day    Minutes of Exercise per Session: 20 min  Stress: No Stress Concern Present (12/17/2023)   [employer] of Occupational Health -  Occupational Stress Questionnaire    Feeling of Stress : Only a little  Social Connections: Moderately Integrated (12/17/2023)   Social Connection and Isolation Panel [NHANES]    Frequency of Communication with Friends and Family: Twice a week    Frequency of Social Gatherings with Friends and Family: Once a week    Attends Religious Services: Never    Database administrator or Organizations: Yes    Attends Engineer, structural: More than 4 times per year    Marital Status: Married  Catering manager Violence: Not At Risk (12/06/2022)   Humiliation, Afraid, Rape, and Kick questionnaire    Fear of Current or Ex-Partner: No    Emotionally Abused: No    Physically Abused: No    Sexually Abused: No    Past Surgical History:  Procedure Laterality Date   appendectomy  1969   Cataract Bilateral 12/2010   COLONOSCOPY     eye lid surgery   2019    Family History  Problem  Relation Age of Onset   Cancer Mother    Pancreatic cancer Father    Colon cancer Neg Hx    Colon polyps Neg Hx    Esophageal cancer Neg Hx    Rectal cancer Neg Hx    Stomach cancer Neg Hx     Allergies  Allergen Reactions   Clarithromycin Other (See Comments)   Penicillins     Current Outpatient Medications on File Prior to Visit  Medication Sig Dispense Refill   aspirin EC (ASPIR-LOW) 81 MG tablet 1 tablet     Biotin 57846 MCG TABS 1 tablet     cholecalciferol (VITAMIN D3) 25 MCG (1000 UNIT) tablet 1 tablet     cyanocobalamin (VITAMIN B12) 100 MCG tablet See admin instructions.     rosuvastatin (CRESTOR) 10 MG tablet TAKE 1 TABLET AT BEDTIME 90 tablet 3   No current facility-administered medications on file prior to visit.    BP 126/70   Pulse 73   Temp 98.7 F (37.1 C) (Oral)   Ht 6' (1.829 m)   Wt 245 lb (111.1 kg)   SpO2 95%   BMI 33.23 kg/m       Objective:   Physical Exam Vitals and nursing note reviewed.  Constitutional:      Appearance: Normal appearance.  HENT:     Right Ear: Tympanic membrane normal.     Left Ear: Tympanic membrane normal.     Nose: Nose normal.     Mouth/Throat:     Mouth: Mucous membranes are moist.  Eyes:     Extraocular Movements:     Right eye: Nystagmus (horizontal) present.     Left eye: Nystagmus (horizontal) present.  Cardiovascular:     Rate and Rhythm: Regular rhythm.     Pulses: Normal pulses.     Heart sounds: Normal heart sounds.  Pulmonary:     Effort: Pulmonary effort is normal.     Breath sounds: Normal breath sounds.  Musculoskeletal:        General: Normal range of motion.  Skin:    General: Skin is warm and dry.  Neurological:     General: No focal deficit present.     Mental Status: He is alert and oriented to person, place, and time.  Psychiatric:        Mood and Affect: Mood normal.        Behavior: Behavior normal.        Thought Content: Thought content normal.  Judgment: Judgment  normal.        Assessment & Plan:  1. Benign paroxysmal positional vertigo due to bilateral vestibular disorder (Primary)  - Ambulatory referral to Physical Therapy  2. Short-term memory loss    12/18/2023   10:20 AM  MMSE - Mini Mental State Exam  Orientation to time 5  Orientation to Place 5  Registration 3  Attention/ Calculation 5  Recall 2  Language- name 2 objects 2  Language- repeat 1  Language- follow 3 step command 3  Language- read & follow direction 1  Write a sentence 1  Copy design 1  Total score 29  MMSE essentially normal. He would like to have an MRI done.   - MR Brain Wo Contrast; Future

## 2023-12-23 ENCOUNTER — Encounter: Payer: Self-pay | Admitting: Physical Therapy

## 2023-12-23 ENCOUNTER — Other Ambulatory Visit: Payer: Self-pay

## 2023-12-23 ENCOUNTER — Ambulatory Visit: Payer: Medicare HMO | Attending: Adult Health | Admitting: Physical Therapy

## 2023-12-23 DIAGNOSIS — H8113 Benign paroxysmal vertigo, bilateral: Secondary | ICD-10-CM | POA: Diagnosis not present

## 2023-12-23 DIAGNOSIS — R2681 Unsteadiness on feet: Secondary | ICD-10-CM | POA: Diagnosis not present

## 2023-12-23 DIAGNOSIS — R42 Dizziness and giddiness: Secondary | ICD-10-CM | POA: Insufficient documentation

## 2023-12-23 NOTE — Therapy (Signed)
OUTPATIENT PHYSICAL THERAPY VESTIBULAR EVALUATION     Patient Name: Victor Zamora MRN: [id number] DOB:June 21, 1951, 73 y.o., male Today's Date: 12/23/2023  END OF SESSION:  PT End of Session - 12/23/23 1452     Visit Number 1    Number of Visits 9    Date for PT Re-Evaluation 01/23/24    Authorization Type [employer] Medicare/T[employer]are    PT Start Time 0802    PT Stop Time 0846    PT Time Calculation (min) 44 min    Activity Tolerance Patient tolerated treatment well    Behavior During Therapy [facility] for tasks assessed/performed             Past Medical History:  Diagnosis Date   Cataract    Colon polyps    History of skin cancer    Hyperlipidemia    Pre-diabetes    Past Surgical History:  Procedure Laterality Date   appendectomy  1969   Cataract Bilateral 12/2010   COLONOSCOPY     eye lid surgery   2019   There are no active problems to display for this patient.   PCP: Shirline Frees, NP REFERRING PROVIDER: Shirline Frees, NP  REFERRING DIAG: [phone] (ICD-10-CM) - Benign paroxysmal positional vertigo due to bilateral vestibular disorder   THERAPY DIAG:  Dizziness and giddiness  Unsteadiness on feet  ONSET DATE: 12/18/2023 (MD referral)-6 months of vertigo  Rationale for Evaluation and Treatment: Rehabilitation  SUBJECTIVE:   SUBJECTIVE STATEMENT: Dizziness has been going on since July when I had a Mohs surgery.  Feel "instantaneous" when I turn over in bed-head swirls and then it's done.  If I get out of chair and turn too quickly, head will spin and then it stops.  Almost like I'm drunk.  Also noticing some memory loss and I'm going to have MRI Thursday. Pt accompanied by: self  PERTINENT HISTORY:  Per MD report: He reports that back in July he had Mohs surgery on his left ear. Since that time ( about 6 months) he has been experiencing " dizzy spells". He thought this would be temporary but never it never went away. He reports that when he gets out of the chair  and turns around, gets out of bed, or bends over to tie his shoes and stands up too quickly he will get dizzy. He feels like the world is spinning around him. This is not present every time but happens more then he would like. He has not had any falls or syncopal episodes. Other PMH:  HLD, pre-diabetes  PAIN:  Are you having pain? No  PRECAUTIONS: Fall-bent down to pick something up and had a "spell" and controlled my descent to floor.  RED FLAGS: None   WEIGHT BEARING RESTRICTIONS: No  FALLS: Has patient fallen in last 6 months? Yes. Number of falls 1  LIVING ENVIRONMENT: Lives with: lives with their spouse Lives in: House/apartment Stairs: Stairs into home and up to second floor, rails Has following equipment at home: None  PLOF: Independent, enjoys yard work, house work, travelling  PATIENT GOALS: To stop getting dizzy spells  OBJECTIVE:  Note: Objective measures were completed at Evaluation unless otherwise noted.  DIAGNOSTIC FINDINGS: MRI scheduled 12/25/2023  COGNITION: Overall cognitive status: Within functional limits for tasks assessed and does report some memory decline (MD aware)   POSTURE:  rounded shoulders  Cervical ROM:  AROM WFL   BED MOBILITY:  Independent  TRANSFERS: Assistive device utilized: None  Sit to stand: Modified independence Stand  to sit: Modified independence  GAIT: Gait pattern: step through pattern Distance walked: 50 ft Assistive device utilized: None Level of assistance: Modified independence  VESTIBULAR ASSESSMENT:  GENERAL OBSERVATION: No acute distress   SYMPTOM BEHAVIOR: Subjective history:  Dizziness has been going on since July when I had a Mohs surgery.  Feel "instantaneous" when I turn over in bed-head swirls and then it's done.  If I get out of chair and turn too quickly, head will spin and then it stops.  Almost like I'm drunk.  Non-Vestibular symptoms: tinnitus and reports tinnitus is not new (some days louder than  others)  Type of dizziness: Imbalance (Disequilibrium), Spinning/Vertigo, and Unsteady with head/body turns  Frequency: multiple times per day  Duration: seconds  Aggravating factors: Induced by position change: rolling to the right, rolling to the left, supine to sit, and sit to stand and Induced by motion: looking up at the ceiling, bending down to the ground, turning body quickly, and turning head quickly  Relieving factors: slow movements  Progression of symptoms: unchanged  OCULOMOTOR EXAM:  Ocular Alignment: abnormal and L eye slightly lower  Ocular ROM: No Limitations  Spontaneous Nystagmus: absent  Gaze-Induced Nystagmus: absent  Smooth Pursuits: intact  Saccades: intact  Convergence/Divergence: 2 cm   VESTIBULAR - OCULAR REFLEX:   Slow VOR: Normal  VOR Cancellation: Comment: slight lag, no symptoms  Head-Impulse Test: HIT Right: positive HIT Left: negative    POSITIONAL TESTING: Right Dix-Hallpike: no nystagmus and mild spinning, brief Left Dix-Hallpike: no nystagmus noted; extra eye motions; pt does c/o minor spinning; upon return to sitting, very big "swirl" that passes  Right Roll Test: no nystagmus and no symptoms; briefly felt spinning upon coming up to sit EOM Left Roll Test: no nystagmus and no symptoms R sidelying:  3/10, lasts 1-2 sec; 7-8/10 upon return to sit (repeated x 3 reps) and slightly less reports each time  MOTION SENSITIVITY:  Motion Sensitivity Quotient Intensity: 0 = none, 1 = Lightheaded, 2 = Mild, 3 = Moderate, 4 = Severe, 5 = Vomiting  Intensity  1. Sitting to supine   2. Supine to L side   3. Supine to R side   4. Supine to sitting   5. L Hallpike-Dix   6. Up from L    7. R Hallpike-Dix   8. Up from R    9. Sitting, head tipped to L knee   10. Head up from L knee   11. Sitting, head tipped to R knee   12. Head up from R knee   13. Sitting head turns x5   14.Sitting head nods x5   15. In stance, 180 turn to L    16. In stance, 180  turn to R       M-CTSIB  Condition 1: Firm Surface, EO 30 Sec, Normal Sway  Condition 2: Firm Surface, EC 30 Sec, Normal Sway  Condition 3: Foam Surface, EO 30 Sec, Mild Sway  Condition 4: Foam Surface, EC 13.34 Sec, Severe Sway  TREATMENT DATE: 12/23/2023    PATIENT EDUCATION: Education details: Eval results, POC Person educated: Patient Education method: Explanation Education comprehension: verbalized understanding  HOME EXERCISE PROGRAM:  GOALS: Goals reviewed with patient? Yes  SHORT TERM GOALS: = LTGs  LONG TERM GOALS: Target date: 01/23/2024  Pt will be independent with HEP for improved dizziness. Baseline:  Goal status: INITIAL  2.  Pt will report 0/10 dizziness with rolling and bed mobility. Baseline:  Goal status: INITIAL  3.  Pt will perform Condition 4 on MCTSIB for 30 seconds mod or better sway, for improved vestibular system use for balance. Baseline:  Goal status: INITIAL  ASSESSMENT:  CLINICAL IMPRESSION: Patient is a 73 y.o. male who was seen today for physical therapy evaluation and treatment for vertigo.   He reports onset of spinning, starting in July.  He reports brief spinning and off balance sensation with rolling, supine>sit and sit>stand with quick turns.  He denies any recent falls, head injury, or illness.  Oculomotor testing is normal, except pt's L eye is slightly lower than R; he does not indicate this is anything new.  With VOR testing, he has slight lag with VOR cancellation, but does not note symptoms.  He does have positive R HIT.  With MCTSIB testing, he is unable to maintain full 30 seconds (only 13.34 sec) on Condition 4 with EC on foam, indicating decreased vestibular system use for balance.  With positional testing, pt does not appear to have nystagmus in any position, but he does note slight and brief dizziness  in both R and L Dix-Hallpike; he also notes more intense but brief spinning upon sitting after [employer] and roll testing.  With testing today, he does appear to have decreased vestibular function, which may be contributing to his unsteadiness/dizziness feeling today.  He would benefit  from skilled PT to address the above stated deficits for improved dizziness and balance.  OBJECTIVE IMPAIRMENTS: decreased balance and dizziness.   ACTIVITY LIMITATIONS: transfers, bed mobility, and locomotion level  PARTICIPATION LIMITATIONS: meal prep, cleaning, community activity, and yard work  PERSONAL FACTORS: 1-2 comorbidities: see above  are also affecting patient's functional outcome.   REHAB POTENTIAL: Good  CLINICAL DECISION MAKING: Stable/uncomplicated  EVALUATION COMPLEXITY: Low   PLAN:  PT FREQUENCY: 2x/week  PT DURATION: 4 weeks plus eval  PLANNED INTERVENTIONS: 97110-Therapeutic exercises, 97530- Therapeutic activity, [id number]- Neuromuscular re-education, 97535- Self Care, 29562- Manual therapy, [phone]- Canalith repositioning, Patient/Family education, Balance training, and Vestibular training  PLAN FOR NEXT SESSION: Reassess Dix Hallpike and horizontal roll tests/treat as appropriate. Begin habituation with Brand-Daroff and corner balance exercises.   Gean Maidens., PT 12/23/2023, 3:08 PM   [facility] [address] [place], [address] [place], [place] [address] Phone # [phone] Fax # [phone]

## 2023-12-25 ENCOUNTER — Ambulatory Visit: Payer: Medicare HMO

## 2023-12-25 ENCOUNTER — Ambulatory Visit
Admission: RE | Admit: 2023-12-25 | Discharge: 2023-12-25 | Disposition: A | Discharge status: Home / Self Care | Payer: Medicare HMO | Source: Ambulatory Visit | Attending: Adult Health | Admitting: Adult Health

## 2023-12-25 DIAGNOSIS — R2681 Unsteadiness on feet: Secondary | ICD-10-CM | POA: Diagnosis not present

## 2023-12-25 DIAGNOSIS — R42 Dizziness and giddiness: Secondary | ICD-10-CM | POA: Diagnosis not present

## 2023-12-25 DIAGNOSIS — H8113 Benign paroxysmal vertigo, bilateral: Secondary | ICD-10-CM | POA: Diagnosis not present

## 2023-12-25 DIAGNOSIS — G9389 Other specified disorders of brain: Secondary | ICD-10-CM | POA: Diagnosis not present

## 2023-12-25 DIAGNOSIS — R413 Other amnesia: Secondary | ICD-10-CM

## 2023-12-25 NOTE — Therapy (Signed)
OUTPATIENT PHYSICAL THERAPY VESTIBULAR TREATMENT     Patient Name: Victor Zamora MRN: [id number] DOB:1951-07-23, 73 y.o., male Today's Date: 12/25/2023  END OF SESSION:  PT End of Session - 12/25/23 0849     Visit Number 2    Number of Visits 9    Date for PT Re-Evaluation 01/23/24    Authorization Type [employer] Medicare/Tricare    PT Start Time 0845    PT Stop Time 0930    PT Time Calculation (min) 45 min    Activity Tolerance Patient tolerated treatment well    Behavior During Therapy [facility] for tasks assessed/performed             Past Medical History:  Diagnosis Date   Cataract    Colon polyps    History of skin cancer    Hyperlipidemia    Pre-diabetes    Past Surgical History:  Procedure Laterality Date   appendectomy  1969   Cataract Bilateral 12/2010   COLONOSCOPY     eye lid surgery   2019   There are no active problems to display for this patient.   PCP: Shirline Frees, NP REFERRING PROVIDER: Shirline Frees, NP  REFERRING DIAG: [phone] (ICD-10-CM) - Benign paroxysmal positional vertigo due to bilateral vestibular disorder   THERAPY DIAG:  Dizziness and giddiness  Unsteadiness on feet  ONSET DATE: 12/18/2023 (MD referral)-6 months of vertigo  Rationale for Evaluation and Treatment: Rehabilitation  SUBJECTIVE:   SUBJECTIVE STATEMENT: "Another day of getting dizzy". Turning over in bed or sitting up need to wait a few seconds. Arising from chair and turning cause a "swirl and it's gone" Pt accompanied by: self  PERTINENT HISTORY:  Per MD report: He reports that back in July he had Mohs surgery on his left ear. Since that time ( about 6 months) he has been experiencing " dizzy spells". He thought this would be temporary but never it never went away. He reports that when he gets out of the chair and turns around, gets out of bed, or bends over to tie his shoes and stands up too quickly he will get dizzy. He feels like the world is spinning around him. This  is not present every time but happens more then he would like. He has not had any falls or syncopal episodes. Other PMH:  HLD, pre-diabetes  PAIN:  Are you having pain? No  PRECAUTIONS: Fall-bent down to pick something up and had a "spell" and controlled my descent to floor.  RED FLAGS: None   WEIGHT BEARING RESTRICTIONS: No  FALLS: Has patient fallen in last 6 months? Yes. Number of falls 1  LIVING ENVIRONMENT: Lives with: lives with their spouse Lives in: House/apartment Stairs: Stairs into home and up to second floor, rails Has following equipment at home: None  PLOF: Independent, enjoys yard work, house work, travelling  PATIENT GOALS: To stop getting dizzy spells  OBJECTIVE:   TODAY'S TREATMENT: 12/25/23 Activity Comments  Roll test Left/right no nystagmus or vertigo  Left Dix-Hallpike No nystagmus, no vertigo  Right Dix-Hallpike Possibly 1-2 beats nystagmus  Brandt-Daroff x 5 reps Increased symptoms with right sidelying  Corner balance Feet together EC x 30 sec. Head turns EO/EC x 2 reps Tandem stance x 30 sec        PATIENT EDUCATION: Education details: Eval results, POC Person educated: Patient Education method: Explanation Education comprehension: verbalized understanding  HOME EXERCISE PROGRAM: Access Code: [email or web address] URL: [email or web address] Date: 12/25/2023 Prepared by: Shary Decamp  Exercises -  Brandt-Daroff Vestibular Exercise  - 1 x daily - 7 x weekly - 5 reps - Corner Balance Feet Together With Eyes Closed  - 1 x daily - 7 x weekly - 3 sets - 30 sec hold - Corner Balance Feet Together: Eyes Open With Head Turns  - 1 x daily - 7 x weekly - 3 sets - 2 reps - Corner Balance Feet Together: Eyes Closed With Head Turns  - 1 x daily - 7 x weekly - 3 sets - 2 reps - Tandem Stance in Corner  - 1 x daily - 7 x weekly - 3 sets - 30 sec hold   Note: Objective measures were completed at Evaluation unless otherwise noted.  DIAGNOSTIC FINDINGS:  MRI scheduled 12/25/2023  COGNITION: Overall cognitive status: Within functional limits for tasks assessed and does report some memory decline (MD aware)   POSTURE:  rounded shoulders  Cervical ROM:  AROM WFL   BED MOBILITY:  Independent  TRANSFERS: Assistive device utilized: None  Sit to stand: Modified independence Stand to sit: Modified independence  GAIT: Gait pattern: step through pattern Distance walked: 50 ft Assistive device utilized: None Level of assistance: Modified independence  VESTIBULAR ASSESSMENT:  GENERAL OBSERVATION: No acute distress   SYMPTOM BEHAVIOR: Subjective history:  Dizziness has been going on since July when I had a Mohs surgery.  Feel "instantaneous" when I turn over in bed-head swirls and then it's done.  If I get out of chair and turn too quickly, head will spin and then it stops.  Almost like I'm drunk.  Non-Vestibular symptoms: tinnitus and reports tinnitus is not new (some days louder than others)  Type of dizziness: Imbalance (Disequilibrium), Spinning/Vertigo, and Unsteady with head/body turns  Frequency: multiple times per day  Duration: seconds  Aggravating factors: Induced by position change: rolling to the right, rolling to the left, supine to sit, and sit to stand and Induced by motion: looking up at the ceiling, bending down to the ground, turning body quickly, and turning head quickly  Relieving factors: slow movements  Progression of symptoms: unchanged  OCULOMOTOR EXAM:  Ocular Alignment: abnormal and L eye slightly lower  Ocular ROM: No Limitations  Spontaneous Nystagmus: absent  Gaze-Induced Nystagmus: absent  Smooth Pursuits: intact  Saccades: intact  Convergence/Divergence: 2 cm   VESTIBULAR - OCULAR REFLEX:   Slow VOR: Normal  VOR Cancellation: Comment: slight lag, no symptoms  Head-Impulse Test: HIT Right: positive HIT Left: negative    POSITIONAL TESTING: Right Dix-Hallpike: no nystagmus and mild spinning,  brief Left Dix-Hallpike: no nystagmus noted; extra eye motions; pt does c/o minor spinning; upon return to sitting, very big "swirl" that passes  Right Roll Test: no nystagmus and no symptoms; briefly felt spinning upon coming up to sit EOM Left Roll Test: no nystagmus and no symptoms R sidelying:  3/10, lasts 1-2 sec; 7-8/10 upon return to sit (repeated x 3 reps) and slightly less reports each time  MOTION SENSITIVITY:  Motion Sensitivity Quotient Intensity: 0 = none, 1 = Lightheaded, 2 = Mild, 3 = Moderate, 4 = Severe, 5 = Vomiting  Intensity  1. Sitting to supine   2. Supine to L side   3. Supine to R side   4. Supine to sitting   5. L Hallpike-Dix   6. Up from L    7. R Hallpike-Dix   8. Up from R    9. Sitting, head tipped to L knee   10. Head up from L knee  11. Sitting, head tipped to R knee   12. Head up from R knee   13. Sitting head turns x5   14.Sitting head nods x5   15. In stance, 180 turn to L    16. In stance, 180 turn to R       M-CTSIB  Condition 1: Firm Surface, EO 30 Sec, Normal Sway  Condition 2: Firm Surface, EC 30 Sec, Normal Sway  Condition 3: Foam Surface, EO 30 Sec, Mild Sway  Condition 4: Foam Surface, EC 13.34 Sec, Severe Sway                                                                                                                              TREATMENT DATE: 12/23/2023     GOALS: Goals reviewed with patient? Yes  SHORT TERM GOALS: = LTGs  LONG TERM GOALS: Target date: 01/23/2024  Pt will be independent with HEP for improved dizziness. Baseline:  Goal status: INITIAL  2.  Pt will report 0/10 dizziness with rolling and bed mobility. Baseline:  Goal status: INITIAL  3.  Pt will perform Condition 4 on MCTSIB for 30 seconds mod or better sway, for improved vestibular system use for balance. Baseline:  Goal status: INITIAL  ASSESSMENT:  CLINICAL IMPRESSION: Symptoms remain the same as previous with brief episodes of "swirl"  with position change more prominent to right sidelying during Brandt-Daroff.  No nystagmus or overt positional dizziness noted with positional testing.  Instructed in habituation for positional dizziness and initiated static balance activities for further habituation and progression. Demo understanding. Continued sessions to progress POC details  OBJECTIVE IMPAIRMENTS: decreased balance and dizziness.   ACTIVITY LIMITATIONS: transfers, bed mobility, and locomotion level  PARTICIPATION LIMITATIONS: meal prep, cleaning, community activity, and yard work  PERSONAL FACTORS: 1-2 comorbidities: see above  are also affecting patient's functional outcome.   REHAB POTENTIAL: Good  CLINICAL DECISION MAKING: Stable/uncomplicated  EVALUATION COMPLEXITY: Low   PLAN:  PT FREQUENCY: 2x/week  PT DURATION: 4 weeks plus eval  PLANNED INTERVENTIONS: 97110-Therapeutic exercises, 97530- Therapeutic activity, [id number]- Neuromuscular re-education, 97535- Self Care, 65784- Manual therapy, [phone]- Canalith repositioning, Patient/Family education, Balance training, and Vestibular training  PLAN FOR NEXT SESSION: Reassess Dix Hallpike and horizontal roll tests/treat as appropriate. Begin habituation with Brand-Daroff and corner balance exercises.   Dion Body, PT 12/25/2023, 8:49 AM   [facility] at [facility] [address] [place], [address] [place], [place] [address] Phone # [phone] Fax # [phone]

## 2023-12-31 ENCOUNTER — Ambulatory Visit: Payer: Medicare HMO | Attending: Adult Health

## 2023-12-31 DIAGNOSIS — R42 Dizziness and giddiness: Secondary | ICD-10-CM | POA: Insufficient documentation

## 2023-12-31 DIAGNOSIS — R2681 Unsteadiness on feet: Secondary | ICD-10-CM | POA: Insufficient documentation

## 2023-12-31 NOTE — Therapy (Signed)
 OUTPATIENT PHYSICAL THERAPY VESTIBULAR TREATMENT and D/C Summary     Patient Name: Victor Zamora MRN: [id number] DOB:1951-04-16, 73 y.o., male Today's Date: 12/31/2023 PHYSICAL THERAPY DISCHARGE SUMMARY  Visits from Start of Care: 3  Current functional level related to goals / functional outcomes: See below   Remaining deficits: Intermittent dizziness, motion sensitivity   Education / Equipment: HEP   Patient agrees to discharge. Patient goals were partially met. Patient is being discharged due to being pleased with the current functional level.   END OF SESSION:  PT End of Session - 12/31/23 0836     Visit Number 3    Number of Visits 9    Date for PT Re-Evaluation 01/23/24    Authorization Type [employer] Medicare/Tricare    PT Start Time [phone]    PT Stop Time 0930    PT Time Calculation (min) 55 min    Activity Tolerance Patient tolerated treatment well    Behavior During Therapy [facility] for tasks assessed/performed             Past Medical History:  Diagnosis Date   Cataract    Colon polyps    History of skin cancer    Hyperlipidemia    Pre-diabetes    Past Surgical History:  Procedure Laterality Date   appendectomy  1969   Cataract Bilateral 12/2010   COLONOSCOPY     eye lid surgery   2019   There are no active problems to display for this patient.   PCP: Merna Huxley, NP REFERRING PROVIDER: Merna Huxley, NP  REFERRING DIAG: [phone] (ICD-10-CM) - Benign paroxysmal positional vertigo due to bilateral vestibular disorder   THERAPY DIAG:  Dizziness and giddiness  Unsteadiness on feet  ONSET DATE: 12/18/2023 (MD referral)-6 months of vertigo  Rationale for Evaluation and Treatment: Rehabilitation  SUBJECTIVE:   SUBJECTIVE STATEMENT: No change.  Noting continued issues with dizziness with episodes brought on by position change.  Pt accompanied by: self  PERTINENT HISTORY:  Per MD report: He reports that back in July he had Mohs surgery on his left  ear. Since that time ( about 6 months) he has been experiencing  dizzy spells. He thought this would be temporary but never it never went away. He reports that when he gets out of the chair and turns around, gets out of bed, or bends over to tie his shoes and stands up too quickly he will get dizzy. He feels like the world is spinning around him. This is not present every time but happens more then he would like. He has not had any falls or syncopal episodes. Other PMH:  HLD, pre-diabetes  PAIN:  Are you having pain? No  PRECAUTIONS: Fall-bent down to pick something up and had a spell and controlled my descent to floor.  RED FLAGS: None   WEIGHT BEARING RESTRICTIONS: No  FALLS: Has patient fallen in last 6 months? Yes. Number of falls 1  LIVING ENVIRONMENT: Lives with: lives with their spouse Lives in: House/apartment Stairs: Stairs into home and up to second floor, rails Has following equipment at home: None  PLOF: Independent, enjoys yard work, house work, travelling  PATIENT GOALS: To stop getting dizzy spells  OBJECTIVE:   TODAY'S TREATMENT: 12/31/23 Activity Comments  Right self-Epley x 5 reps. Holding ea position x 30 sec No nystagmus noted in Dix-Hallpike position,  Moving his eyes volitionally. Notes brief dizziness in position 3 and when arising. Decreased symptoms with subsequent reps. Last rep with eyes closed  to see if this helps reduce  Forward T at counter No symptoms  Corner balance On firm/foam, multisensory  Discussion of strategies for vestibular stimulation Encouraged to try proactive head movements prior to arising from prolonged sitting to see if this alleviates motion sensitivity, use of foam for balance exercises, more dynamic activities such as plyometrics in pool           TODAY'S TREATMENT: 12/25/23 Activity Comments  Roll test Left/right no nystagmus or vertigo  Left Dix-Hallpike No nystagmus, no vertigo  Right Dix-Hallpike Possibly 1-2 beats  nystagmus  Brandt-Daroff x 5 reps Increased symptoms with right sidelying  Corner balance Feet together EC x 30 sec. Head turns EO/EC x 2 reps Tandem stance x 30 sec        PATIENT EDUCATION: Education details: Eval results, POC Person educated: Patient Education method: Explanation Education comprehension: verbalized understanding  HOME EXERCISE PROGRAM: Access Code: [email or web address] URL: [email or web address] Date: 12/25/2023 Prepared by: Burnard Sandifer  Exercises - Brandt-Daroff Vestibular Exercise  - 1 x daily - 7 x weekly - 5 reps - Corner Balance Feet Together With Eyes Closed  - 1 x daily - 7 x weekly - 3 sets - 30 sec hold - Corner Balance Feet Together: Eyes Open With Head Turns  - 1 x daily - 7 x weekly - 3 sets - 2 reps - Corner Balance Feet Together: Eyes Closed With Head Turns  - 1 x daily - 7 x weekly - 3 sets - 2 reps - Tandem Stance in Corner  - 1 x daily - 7 x weekly - 3 sets - 30 sec hold   Note: Objective measures were completed at Evaluation unless otherwise noted.  DIAGNOSTIC FINDINGS: MRI scheduled 12/25/2023  COGNITION: Overall cognitive status: Within functional limits for tasks assessed and does report some memory decline (MD aware)   POSTURE:  rounded shoulders  Cervical ROM:  AROM WFL   BED MOBILITY:  Independent  TRANSFERS: Assistive device utilized: None  Sit to stand: Modified independence Stand to sit: Modified independence  GAIT: Gait pattern: step through pattern Distance walked: 50 ft Assistive device utilized: None Level of assistance: Modified independence  VESTIBULAR ASSESSMENT:  GENERAL OBSERVATION: No acute distress   SYMPTOM BEHAVIOR: Subjective history:  Dizziness has been going on since July when I had a Mohs surgery.  Feel instantaneous when I turn over in bed-head swirls and then it's done.  If I get out of chair and turn too quickly, head will spin and then it stops.  Almost like I'm drunk.  Non-Vestibular  symptoms: tinnitus and reports tinnitus is not new (some days louder than others)  Type of dizziness: Imbalance (Disequilibrium), Spinning/Vertigo, and Unsteady with head/body turns  Frequency: multiple times per day  Duration: seconds  Aggravating factors: Induced by position change: rolling to the right, rolling to the left, supine to sit, and sit to stand and Induced by motion: looking up at the ceiling, bending down to the ground, turning body quickly, and turning head quickly  Relieving factors: slow movements  Progression of symptoms: unchanged  OCULOMOTOR EXAM:  Ocular Alignment: abnormal and L eye slightly lower  Ocular ROM: No Limitations  Spontaneous Nystagmus: absent  Gaze-Induced Nystagmus: absent  Smooth Pursuits: intact  Saccades: intact  Convergence/Divergence: 2 cm   VESTIBULAR - OCULAR REFLEX:   Slow VOR: Normal  VOR Cancellation: Comment: slight lag, no symptoms  Head-Impulse Test: HIT Right: positive HIT Left: negative    POSITIONAL TESTING: Right Dix-Hallpike: no nystagmus  and mild spinning, brief Left Dix-Hallpike: no nystagmus noted; extra eye motions; pt does c/o minor spinning; upon return to sitting, very big swirl that passes  Right Roll Test: no nystagmus and no symptoms; briefly felt spinning upon coming up to sit EOM Left Roll Test: no nystagmus and no symptoms R sidelying:  3/10, lasts 1-2 sec; 7-8/10 upon return to sit (repeated x 3 reps) and slightly less reports each time  MOTION SENSITIVITY:  Motion Sensitivity Quotient Intensity: 0 = none, 1 = Lightheaded, 2 = Mild, 3 = Moderate, 4 = Severe, 5 = Vomiting  Intensity  1. Sitting to supine   2. Supine to L side   3. Supine to R side   4. Supine to sitting   5. L Hallpike-Dix   6. Up from L    7. R Hallpike-Dix   8. Up from R    9. Sitting, head tipped to L knee   10. Head up from L knee   11. Sitting, head tipped to R knee   12. Head up from R knee   13. Sitting head turns x5    14.Sitting head nods x5   15. In stance, 180 turn to L    16. In stance, 180 turn to R       M-CTSIB  Condition 1: Firm Surface, EO 30 Sec, Normal Sway  Condition 2: Firm Surface, EC 30 Sec, Normal Sway  Condition 3: Foam Surface, EO 30 Sec, Mild Sway  Condition 4: Foam Surface, EC 13.34 Sec, Severe Sway                                                                                                                              TREATMENT DATE: 12/23/2023     GOALS: Goals reviewed with patient? Yes  SHORT TERM GOALS: = LTGs  LONG TERM GOALS: Target date: 01/23/2024  Pt will be independent with HEP for improved dizziness. Baseline:  Goal status: MET  2.  Pt will report 0/10 dizziness with rolling and bed mobility. Baseline: variable Goal status: NOT MET  3.  Pt will perform Condition 4 on MCTSIB for 30 seconds mod or better sway, for improved vestibular system use for balance. Baseline: mild sway x 30 sec Goal status: MET  ASSESSMENT:  CLINICAL IMPRESSION: Pt notes intermittent occurrences of dizziness/motion sensitivity with rolling and position changes such as sit to stand after prolonged sitting, bending forward, laying back.  No overt nystagmus appreciated and symptoms are brief in nature 3-5 seconds.  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  Discussed ongoing strategies of habituation and multi-sensory balance challenges to progress abilities. Demo improved stability under M-CTSIB with mild sway x 30 sec condition 4.  Verbalizes understanding of self-progression and requests hold tx at this time for self-performance.   OBJECTIVE IMPAIRMENTS: decreased balance and dizziness.   ACTIVITY LIMITATIONS: transfers, bed mobility, and locomotion level  PARTICIPATION LIMITATIONS: meal prep, cleaning,  community activity, and yard work  PERSONAL FACTORS: 1-2 comorbidities: see above  are also affecting patient's functional outcome.   REHAB  POTENTIAL: Good  CLINICAL DECISION MAKING: Stable/uncomplicated  EVALUATION COMPLEXITY: Low   PLAN:  PT FREQUENCY: 2x/week  PT DURATION: 4 weeks plus eval  PLANNED INTERVENTIONS: 97110-Therapeutic exercises, 97530- Therapeutic activity, 97112- Neuromuscular re-education, 97535- Self Care, 02859- Manual therapy, [phone]- Canalith repositioning, Patient/Family education, Balance training, and Vestibular training  PLAN FOR NEXT SESSION: D/C to HEP   Jonette MARLA Sandifer, PT 12/31/2023, 8:36 AM   [facility] at [facility] [address] [place], [place] [address] Phone # [phone] Fax # [phone]

## 2024-01-07 ENCOUNTER — Encounter: Payer: Self-pay | Admitting: Adult Health

## 2024-01-14 ENCOUNTER — Encounter: Payer: Medicare HMO | Admitting: Adult Health

## 2024-02-18 ENCOUNTER — Ambulatory Visit (INDEPENDENT_AMBULATORY_CARE_PROVIDER_SITE_OTHER): Payer: Medicare HMO | Admitting: Adult Health

## 2024-02-18 VITALS — BP 108/60 | HR 67 | Temp 98.1°F | Ht 71.0 in | Wt 242.0 lb

## 2024-02-18 DIAGNOSIS — Z0000 Encounter for general adult medical examination without abnormal findings: Secondary | ICD-10-CM | POA: Diagnosis not present

## 2024-02-18 DIAGNOSIS — Z1159 Encounter for screening for other viral diseases: Secondary | ICD-10-CM | POA: Diagnosis not present

## 2024-02-18 DIAGNOSIS — R7303 Prediabetes: Secondary | ICD-10-CM | POA: Diagnosis not present

## 2024-02-18 DIAGNOSIS — Z85828 Personal history of other malignant neoplasm of skin: Secondary | ICD-10-CM

## 2024-02-18 DIAGNOSIS — E782 Mixed hyperlipidemia: Secondary | ICD-10-CM | POA: Diagnosis not present

## 2024-02-18 DIAGNOSIS — N400 Enlarged prostate without lower urinary tract symptoms: Secondary | ICD-10-CM | POA: Diagnosis not present

## 2024-02-18 LAB — CBC
HCT: 46.7 % (ref 39.0–52.0)
Hemoglobin: 15.9 g/dL (ref 13.0–17.0)
MCHC: 34.1 g/dL (ref 30.0–36.0)
MCV: 92 fl (ref 78.0–100.0)
Platelets: 166 10*3/uL (ref 150.0–400.0)
RBC: 5.08 Mil/uL (ref 4.22–5.81)
RDW: 13.4 % (ref 11.5–15.5)
WBC: 5.2 10*3/uL (ref 4.0–10.5)

## 2024-02-18 LAB — LIPID PANEL
Cholesterol: 127 mg/dL (ref 0–200)
HDL: 49.2 mg/dL (ref >39.00)
LDL Cholesterol: 53 mg/dL (ref 0–99)
NonHDL: 77.94
Total CHOL/HDL Ratio: 3
Triglycerides: 125 mg/dL (ref 0.0–149.0)
VLDL: 25 mg/dL (ref 0.0–40.0)

## 2024-02-18 LAB — COMPREHENSIVE METABOLIC PANEL WITH GFR
ALT: 41 U/L (ref 0–53)
AST: 31 U/L (ref 0–37)
Albumin: 4.5 g/dL (ref 3.5–5.2)
Alkaline Phosphatase: 49 U/L (ref 39–117)
BUN: 12 mg/dL (ref 6–23)
CO2: 30 meq/L (ref 19–32)
Calcium: 9.6 mg/dL (ref 8.4–10.5)
Chloride: 102 meq/L (ref 96–112)
Creatinine, Ser: 0.76 mg/dL (ref 0.40–1.50)
GFR: 90.65 mL/min (ref >60.00)
Glucose, Bld: 115 mg/dL — ABNORMAL HIGH (ref 70–99)
Potassium: 4.4 meq/L (ref 3.5–5.1)
Sodium: 139 meq/L (ref 135–145)
Total Bilirubin: 1 mg/dL (ref 0.2–1.2)
Total Protein: 6.9 g/dL (ref 6.0–8.3)

## 2024-02-18 LAB — HEMOGLOBIN A1C: Hgb A1c MFr Bld: 5.9 % (ref 4.6–6.5)

## 2024-02-18 NOTE — Patient Instructions (Signed)
 It was great seeing you today   We will follow up with you regarding your lab work   Please let me know if you need anything

## 2024-02-18 NOTE — Progress Notes (Signed)
 Subjective:    Patient ID: Victor Zamora, male    DOB: [birth year], 73 y.o.   MRN: [id number]  HPI Patient presents for yearly preventative medicine examination. He is a pleasant 73 year old male who  has a past medical history of Cataract, Colon polyps, History of skin cancer, Hyperlipidemia, and Pre-diabetes.  Hyperlipidemia - managed with crestor 10 mg daily. He denies myalgia or fatigue  Lab Results  Component Value Date   CHOL 135 01/09/2023   HDL 63.10 01/09/2023   LDLCALC 55 01/09/2023   TRIG 84.0 01/09/2023   CHOLHDL 2 01/09/2023   Pre Diabetes - not currently on medication. Lab Results  Component Value Date   HGBA1C 5.9 01/09/2023   History of skin cancer - BCC and SCC. Followed by [facility] on a routine basis.Over the last year he had Mohs surgery on the left ear.   BPH - asymptomatic   All immunizations and health maintenance protocols were reviewed with the patient and needed orders were placed.  Appropriate screening laboratory values were ordered for the patient including screening of hyperlipidemia, renal function and hepatic function. If indicated by BPH, a PSA was ordered.  Medication reconciliation,  past medical history, social history, problem list and allergies were reviewed in detail with the patient  Goals were established with regard to weight loss, exercise, and  diet in compliance with medications  Wt Readings from Last 3 Encounters:  02/18/24 242 lb (109.8 kg)  12/18/23 245 lb (111.1 kg)  05/09/23 233 lb 3.2 oz (105.8 kg)   He is up to date on routine colon cancer.   Review of Systems  Constitutional: Negative.   HENT: Negative.    Eyes: Negative.   Respiratory: Negative.    Cardiovascular: Negative.   Gastrointestinal: Negative.   Endocrine: Negative.   Genitourinary: Negative.   Musculoskeletal: Negative.   Skin: Negative.   Allergic/Immunologic: Negative.   Neurological: Negative.   Hematological: Negative.    Psychiatric/Behavioral: Negative.    All other systems reviewed and are negative.  Past Medical History:  Diagnosis Date   Cataract    Colon polyps    History of skin cancer    Hyperlipidemia    Pre-diabetes     Social History   Socioeconomic History   Marital status: Married    Spouse name: Not on file   Number of children: Not on file   Years of education: Not on file   Highest education level: Bachelor's degree (e.g., BA, AB, BS)  Occupational History   Not on file  Tobacco Use   Smoking status: Never   Smokeless tobacco: Never  Vaping Use   Vaping status: Never Used  Substance and Sexual Activity   Alcohol use: Yes    Alcohol/week: 7.0 standard drinks of alcohol    Types: 2 Glasses of wine, 3 Cans of beer, 2 Shots of liquor per week   Drug use: No   Sexual activity: Never  Other Topics Concern   Not on file  Social History Narrative   Not on file   Social Drivers of Health   Financial Resource Strain: Low Risk  (12/17/2023)   Overall Financial Resource Strain (CARDIA)    Difficulty of Paying Living Expenses: Not hard at all  Food Insecurity: No Food Insecurity (12/17/2023)   Hunger Vital Sign    Worried About Running Out of Food in the Last Year: Never true    Ran Out of Food in the Last Year: Never true  Transportation Needs: No Transportation Needs (12/17/2023)   PRAPARE - Administrator, Civil Service (Medical): No    Lack of Transportation (Non-Medical): No  Physical Activity: Insufficiently Active (12/17/2023)   Exercise Vital Sign    Days of Exercise per Week: 1 day    Minutes of Exercise per Session: 20 min  Stress: No Stress Concern Present (12/17/2023)   [employer] of Occupational Health - Occupational Stress Questionnaire    Feeling of Stress : Only a little  Social Connections: Moderately Integrated (12/17/2023)   Social Connection and Isolation Panel [NHANES]    Frequency of Communication with Friends and Family: Twice a week     Frequency of Social Gatherings with Friends and Family: Once a week    Attends Religious Services: Never    Database administrator or Organizations: Yes    Attends Engineer, structural: More than 4 times per year    Marital Status: Married  Catering manager Violence: Not At Risk (12/06/2022)   Humiliation, Afraid, Rape, and Kick questionnaire    Fear of Current or Ex-Partner: No    Emotionally Abused: No    Physically Abused: No    Sexually Abused: No    Past Surgical History:  Procedure Laterality Date   appendectomy  1969   Cataract Bilateral 12/2010   COLONOSCOPY     eye lid surgery   2019    Family History  Problem Relation Age of Onset   Cancer Mother    Pancreatic cancer Father    Colon cancer Neg Hx    Colon polyps Neg Hx    Esophageal cancer Neg Hx    Rectal cancer Neg Hx    Stomach cancer Neg Hx     Allergies  Allergen Reactions   Clarithromycin Other (See Comments)   Penicillins     Current Outpatient Medications on File Prior to Visit  Medication Sig Dispense Refill   aspirin EC (ASPIR-LOW) 81 MG tablet 1 tablet     Biotin 98119 MCG TABS 1 tablet     cholecalciferol (VITAMIN D3) 25 MCG (1000 UNIT) tablet 1 tablet     cyanocobalamin (VITAMIN B12) 100 MCG tablet See admin instructions.     fluorouracil (EFUDEX) 5 % cream Apply topically 2 (two) times daily.     Krill Oil 500 MG CAPS Take by mouth.     rosuvastatin (CRESTOR) 10 MG tablet TAKE 1 TABLET AT BEDTIME 90 tablet 3   No current facility-administered medications on file prior to visit.    BP 108/60   Pulse 67   Temp 98.1 F (36.7 C) (Oral)   Ht 5\' 11"  (1.803 m)   Wt 242 lb (109.8 kg)   SpO2 97%   BMI 33.75 kg/m       Objective:   Physical Exam Vitals and nursing note reviewed.  Constitutional:      General: He is not in acute distress.    Appearance: Normal appearance. He is obese. He is not ill-appearing.  HENT:     Head: Normocephalic and atraumatic.     Right Ear:  Tympanic membrane, ear canal and external ear normal. There is no impacted cerumen.     Left Ear: Tympanic membrane, ear canal and external ear normal. There is no impacted cerumen.     Nose: Nose normal. No congestion or rhinorrhea.     Mouth/Throat:     Mouth: Mucous membranes are moist.     Pharynx: Oropharynx is clear.  Eyes:  Extraocular Movements: Extraocular movements intact.     Conjunctiva/sclera: Conjunctivae normal.     Pupils: Pupils are equal, round, and reactive to light.  Neck:     Vascular: No carotid bruit.  Cardiovascular:     Rate and Rhythm: Normal rate and regular rhythm.     Pulses: Normal pulses.     Heart sounds: No murmur heard.    No friction rub. No gallop.  Pulmonary:     Effort: Pulmonary effort is normal.     Breath sounds: Normal breath sounds.  Abdominal:     General: Abdomen is flat. Bowel sounds are normal. There is no distension.     Palpations: Abdomen is soft. There is no mass.     Tenderness: There is no abdominal tenderness. There is no guarding or rebound.     Hernia: No hernia is present.  Musculoskeletal:        General: Normal range of motion.     Cervical back: Normal range of motion and neck supple.  Lymphadenopathy:     Cervical: No cervical adenopathy.  Skin:    General: Skin is warm and dry.     Capillary Refill: Capillary refill takes less than 2 seconds.  Neurological:     General: No focal deficit present.     Mental Status: He is alert and oriented to person, place, and time.  Psychiatric:        Mood and Affect: Mood normal.        Behavior: Behavior normal.        Thought Content: Thought content normal.        Judgment: Judgment normal.        Assessment & Plan:  1. Routine general medical examination at health care facility (Primary) Today patient counseled on age appropriate routine health concerns for screening and prevention, each reviewed and up to date or declined. Immunizations reviewed and up to date or  declined. Labs ordered and reviewed. Risk factors for depression reviewed and negative. Hearing function and visual acuity are intact. ADLs screened and addressed as needed. Functional ability and level of safety reviewed and appropriate. Education, counseling and referrals performed based on assessed risks today. Patient provided with a copy of personalized plan for preventive services. - Follow up in one year or sooner if needed - Work on weight loss through diet and exercise  2. Mixed hyperlipidemia - Consider increase in statin  - Lipid panel; Future - TSH; Future - CBC; Future - Comprehensive metabolic panel; Future  3. Prediabetes - Consider Metformin  - Lipid panel; Future - TSH; Future - CBC; Future - Comprehensive metabolic panel; Future - Hemoglobin A1c; Future  4. History of skin cancer - per dermatology   5. Benign prostatic hyperplasia without lower urinary tract symptoms  - PSA; Future  6. Need for hepatitis C screening test  - Hep C Antibody; Future  Shirline Frees, NP

## 2024-02-19 LAB — HEPATITIS C ANTIBODY: Hepatitis C Ab: NONREACTIVE

## 2024-02-20 ENCOUNTER — Encounter: Payer: Self-pay | Admitting: Adult Health

## 2024-02-20 LAB — PSA: PSA: 0.74 ng/mL (ref 0.10–4.00)

## 2024-02-20 LAB — TSH: TSH: 1.96 u[IU]/mL (ref 0.35–5.50)

## 2024-04-13 DIAGNOSIS — L723 Sebaceous cyst: Secondary | ICD-10-CM | POA: Diagnosis not present

## 2024-04-13 DIAGNOSIS — Z85828 Personal history of other malignant neoplasm of skin: Secondary | ICD-10-CM | POA: Diagnosis not present

## 2024-04-13 DIAGNOSIS — L820 Inflamed seborrheic keratosis: Secondary | ICD-10-CM | POA: Diagnosis not present

## 2024-04-13 DIAGNOSIS — D485 Neoplasm of uncertain behavior of skin: Secondary | ICD-10-CM | POA: Diagnosis not present

## 2024-04-13 DIAGNOSIS — C4442 Squamous cell carcinoma of skin of scalp and neck: Secondary | ICD-10-CM | POA: Diagnosis not present

## 2024-04-13 DIAGNOSIS — L720 Epidermal cyst: Secondary | ICD-10-CM | POA: Diagnosis not present

## 2024-06-02 DIAGNOSIS — D2262 Melanocytic nevi of left upper limb, including shoulder: Secondary | ICD-10-CM | POA: Diagnosis not present

## 2024-06-02 DIAGNOSIS — D2261 Melanocytic nevi of right upper limb, including shoulder: Secondary | ICD-10-CM | POA: Diagnosis not present

## 2024-06-02 DIAGNOSIS — L821 Other seborrheic keratosis: Secondary | ICD-10-CM | POA: Diagnosis not present

## 2024-06-02 DIAGNOSIS — Z85828 Personal history of other malignant neoplasm of skin: Secondary | ICD-10-CM | POA: Diagnosis not present

## 2024-06-02 DIAGNOSIS — L720 Epidermal cyst: Secondary | ICD-10-CM | POA: Diagnosis not present

## 2024-06-02 DIAGNOSIS — L570 Actinic keratosis: Secondary | ICD-10-CM | POA: Diagnosis not present

## 2024-06-02 DIAGNOSIS — L814 Other melanin hyperpigmentation: Secondary | ICD-10-CM | POA: Diagnosis not present

## 2024-06-02 DIAGNOSIS — D485 Neoplasm of uncertain behavior of skin: Secondary | ICD-10-CM | POA: Diagnosis not present

## 2024-06-02 DIAGNOSIS — L905 Scar conditions and fibrosis of skin: Secondary | ICD-10-CM | POA: Diagnosis not present

## 2024-06-02 DIAGNOSIS — D225 Melanocytic nevi of trunk: Secondary | ICD-10-CM | POA: Diagnosis not present

## 2024-06-02 DIAGNOSIS — D2361 Other benign neoplasm of skin of right upper limb, including shoulder: Secondary | ICD-10-CM | POA: Diagnosis not present

## 2024-06-21 DIAGNOSIS — H5201 Hypermetropia, right eye: Secondary | ICD-10-CM | POA: Diagnosis not present

## 2024-06-21 DIAGNOSIS — Z0100 Encounter for examination of eyes and vision without abnormal findings: Secondary | ICD-10-CM | POA: Diagnosis not present

## 2024-06-21 DIAGNOSIS — H353132 Nonexudative age-related macular degeneration, bilateral, intermediate dry stage: Secondary | ICD-10-CM | POA: Diagnosis not present

## 2024-06-21 DIAGNOSIS — H52223 Regular astigmatism, bilateral: Secondary | ICD-10-CM | POA: Diagnosis not present

## 2024-06-21 DIAGNOSIS — H5212 Myopia, left eye: Secondary | ICD-10-CM | POA: Diagnosis not present

## 2024-06-21 DIAGNOSIS — Z961 Presence of intraocular lens: Secondary | ICD-10-CM | POA: Diagnosis not present

## 2024-06-21 DIAGNOSIS — H524 Presbyopia: Secondary | ICD-10-CM | POA: Diagnosis not present

## 2024-07-08 DIAGNOSIS — H353130 Nonexudative age-related macular degeneration, bilateral, stage unspecified: Secondary | ICD-10-CM | POA: Diagnosis not present

## 2024-07-08 DIAGNOSIS — H35371 Puckering of macula, right eye: Secondary | ICD-10-CM | POA: Diagnosis not present

## 2024-09-17 ENCOUNTER — Other Ambulatory Visit: Payer: Self-pay | Admitting: Adult Health

## 2024-10-21 ENCOUNTER — Encounter: Payer: Self-pay | Admitting: Adult Health

## 2024-10-25 ENCOUNTER — Ambulatory Visit: Admitting: Family Medicine

## 2024-10-25 VITALS — BP 122/74 | HR 78 | Temp 98.5°F | Wt 241.6 lb

## 2024-10-25 DIAGNOSIS — J069 Acute upper respiratory infection, unspecified: Secondary | ICD-10-CM | POA: Diagnosis not present

## 2024-10-25 DIAGNOSIS — R0981 Nasal congestion: Secondary | ICD-10-CM | POA: Diagnosis not present

## 2024-10-25 DIAGNOSIS — R053 Chronic cough: Secondary | ICD-10-CM

## 2024-10-25 LAB — POCT INFLUENZA A/B
Influenza A, POC: NEGATIVE
Influenza B, POC: NEGATIVE

## 2024-10-25 LAB — POC COVID19 BINAXNOW: SARS Coronavirus 2 Ag: NEGATIVE

## 2024-10-25 NOTE — Progress Notes (Signed)
 Established Patient Office Visit   Subjective  Patient ID: Victor Zamora, male    DOB: Jan 19, 1951  Age: 73 y.o. MRN: [id number]  Chief Complaint  Patient presents with   Acute Visit    Patient has a productive cough with cold symptoms since Friday along with a sore throat from coughing.  Patient had flu shot 11/22.     Patient is a 73 year old male followed by Darleene Shape, NP and seen for acute concern.  Patient endorses cough, congestion x 3 days.  Patient states he felt horrible over the weekend but feeling better today.  Patient made appointment as he is going on cruise next week.  Has not taken anything for symptoms.  Felt better after drinking coffee this morning.  States more concerned with chronic productive cough with clear phlegm times months.  Also started having dull epigastric pain that is becoming worse.  Pain is described as a burning sensation prior to eating.  Better with food.  Patient denies history of allergies or heartburn but feels like allergy symptoms may have started this year.  Not taking any medications.    There are no active problems to display for this patient.  Past Medical History:  Diagnosis Date   Cataract    Colon polyps    History of skin cancer    Hyperlipidemia    Pre-diabetes    Past Surgical History:  Procedure Laterality Date   appendectomy  1969   APPENDECTOMY  1969   Cataract Bilateral 12/2010   COLONOSCOPY     COSMETIC SURGERY  07/2018   Upper/lower eye lid   eye lid surgery   2019   Social History   Tobacco Use   Smoking status: Never   Smokeless tobacco: Never  Vaping Use   Vaping status: Never Used  Substance Use Topics   Alcohol use: Yes    Alcohol/week: 7.0 standard drinks of alcohol    Types: 2 Glasses of wine, 3 Cans of beer, 2 Shots of liquor per week   Drug use: No   Family History  Problem Relation Age of Onset   Cancer Mother    Pancreatic cancer Father    Cancer Father    Colon cancer Neg Hx    Colon  polyps Neg Hx    Esophageal cancer Neg Hx    Rectal cancer Neg Hx    Stomach cancer Neg Hx    Allergies  Allergen Reactions   Clarithromycin Other (See Comments)   Penicillins     ROS Negative unless stated above    Objective:     BP 122/74 (BP Location: Left Arm, Patient Position: Sitting, Cuff Size: Large)   Pulse 78   Temp 98.5 F (36.9 C) (Oral)   Wt 241 lb 9.6 oz (109.6 kg)   SpO2 97%   BMI 33.70 kg/m  BP Readings from Last 3 Encounters:  10/25/24 122/74  02/18/24 108/60  12/18/23 126/70   Wt Readings from Last 3 Encounters:  10/25/24 241 lb 9.6 oz (109.6 kg)  02/18/24 242 lb (109.8 kg)  12/18/23 245 lb (111.1 kg)      Physical Exam Constitutional:      General: He is not in acute distress.    Appearance: Normal appearance.  HENT:     Head: Normocephalic and atraumatic.     Right Ear: Tympanic membrane normal.     Left Ear: Tympanic membrane normal.     Nose: Nose normal.     Mouth/Throat:  Mouth: Mucous membranes are moist.  Cardiovascular:     Rate and Rhythm: Normal rate and regular rhythm.     Heart sounds: Normal heart sounds. No murmur heard.    No gallop.  Pulmonary:     Effort: Pulmonary effort is normal. No respiratory distress.     Breath sounds: Normal breath sounds. No wheezing, rhonchi or rales.  Skin:    General: Skin is warm and dry.  Neurological:     Mental Status: He is alert and oriented to person, place, and time.        12/18/2023    9:58 AM 07/18/2022    8:34 AM  Depression screen PHQ 2/9  Decreased Interest 0 0  Down, Depressed, Hopeless 0 0  PHQ - 2 Score 0 0      12/18/2023    9:59 AM 01/09/2023    8:01 AM  GAD 7 : Generalized Anxiety Score  Nervous, Anxious, on Edge 0 0  Control/stop worrying 0 0  Worry too much - different things 0 0  Trouble relaxing 0 0  Restless 0 0  Easily annoyed or irritable 0 0  Afraid - awful might happen 0 0  Total GAD 7 Score 0 0  Anxiety Difficulty Not difficult at all Not  difficult at all     Results for orders placed or performed in visit on 10/25/24  POC Influenza A/B  Result Value Ref Range   Influenza A, POC Negative Negative   Influenza B, POC Negative Negative  POC COVID-19 BinaxNow  Result Value Ref Range   SARS Coronavirus 2 Ag Negative Negative      Assessment & Plan:   Viral URI  Chronic cough -     POCT Influenza A/B -     POC COVID-19 BinaxNow  Sinus congestion   Patient with acute on chronic cough likely due to viral URI.  POC COVID and flu testing negative.  Discussed supportive care including OTC cough/cold medication, rest, hydration, steam from shower, warm fluids, honey.  Discussed common causes of chronic cough including seasonal allergies or GERD.  Medication list reviewed not currently on any ACE I's.  Advised to start trial of OTC allergy medication.  For continued cough start trial of OTC PPI.  For continued or worsening symptoms follow-up with PCP and consider GI referral.  Per chart review it appears pt previously seen by Pulm.  Return if symptoms worsen or fail to improve.   Clotilda JONELLE Single, MD

## 2024-10-26 DIAGNOSIS — D485 Neoplasm of uncertain behavior of skin: Secondary | ICD-10-CM | POA: Diagnosis not present

## 2024-10-26 DIAGNOSIS — L110 Acquired keratosis follicularis: Secondary | ICD-10-CM | POA: Diagnosis not present

## 2024-10-29 ENCOUNTER — Ambulatory Visit: Payer: Self-pay

## 2024-10-29 ENCOUNTER — Ambulatory Visit: Admitting: Adult Health

## 2024-10-29 VITALS — BP 110/70 | HR 86 | Temp 98.7°F | Ht 71.0 in | Wt 242.0 lb

## 2024-10-29 DIAGNOSIS — J0140 Acute pansinusitis, unspecified: Secondary | ICD-10-CM

## 2024-10-29 MED ORDER — DOXYCYCLINE HYCLATE 100 MG PO CAPS: 100.0000 mg | ORAL_CAPSULE | Freq: Two times a day (BID) | ORAL | 0 refills | Status: AC

## 2024-10-29 NOTE — Progress Notes (Signed)
 Subjective:    Patient ID: Victor Zamora, male    DOB: 1951/09/14, 73 y.o.   MRN: [id number]  HPI  Discussed the use of AI scribe software for clinical note transcription with the patient, who gave verbal consent to proceed.  History of Present Illness   Josean Lycan is a 73 year old male who presents with a persistent cough and sinus congestion.  He has had cough and sinus congestion for about two weeks. Mucus was initially clear and is now yellow. The cough is persistent, improves with cough drops, and is triggered by postnasal drainage. Symptoms are worse when lying down and on waking. He does have sinus pressure  Flu and COVID tests were negative earlier in the week. He has no shortness of breath, wheezing, or rasping with breathing. Symptoms have not improved since his earlier visit this week.       Review of Systems See HPI   Past Medical History:  Diagnosis Date   Cataract    Colon polyps    History of skin cancer    Hyperlipidemia    Pre-diabetes     Social History   Socioeconomic History   Marital status: Married    Spouse name: Not on file   Number of children: Not on file   Years of education: Not on file   Highest education level: Bachelor's degree (e.g., BA, AB, BS)  Occupational History   Not on file  Tobacco Use   Smoking status: Never   Smokeless tobacco: Never  Vaping Use   Vaping status: Never Used  Substance and Sexual Activity   Alcohol use: Yes    Alcohol/week: 7.0 standard drinks of alcohol    Types: 2 Glasses of wine, 3 Cans of beer, 2 Shots of liquor per week   Drug use: No   Sexual activity: Never  Other Topics Concern   Not on file  Social History Narrative   Not on file   Social Drivers of Health   Financial Resource Strain: Low Risk  (10/25/2024)   Overall Financial Resource Strain (CARDIA)    Difficulty of Paying Living Expenses: Not hard at all  Food Insecurity: No Food Insecurity (10/25/2024)   Hunger Vital Sign     Worried About Running Out of Food in the Last Year: Never true    Ran Out of Food in the Last Year: Never true  Transportation Needs: No Transportation Needs (10/25/2024)   PRAPARE - Administrator, Civil Service (Medical): No    Lack of Transportation (Non-Medical): No  Physical Activity: Insufficiently Active (10/25/2024)   Exercise Vital Sign    Days of Exercise per Week: 2 days    Minutes of Exercise per Session: 30 min  Stress: Stress Concern Present (10/25/2024)   [employer] of Occupational Health - Occupational Stress Questionnaire    Feeling of Stress: To some extent  Social Connections: Socially Isolated (10/25/2024)   Social Connection and Isolation Panel    Frequency of Communication with Friends and Family: Once a week    Frequency of Social Gatherings with Friends and Family: Once a week    Attends Religious Services: Never    Database Administrator or Organizations: No    Attends Banker Meetings: Not on file    Marital Status: Married  Intimate Partner Violence: Not At Risk (12/06/2022)   Humiliation, Afraid, Rape, and Kick questionnaire    Fear of Current or Ex-Partner: No  Emotionally Abused: No    Physically Abused: No    Sexually Abused: No    Past Surgical History:  Procedure Laterality Date   appendectomy  1969   APPENDECTOMY  1969   Cataract Bilateral 12/2010   COLONOSCOPY     COSMETIC SURGERY  07/2018   Upper/lower eye lid   eye lid surgery   2019    Family History  Problem Relation Age of Onset   Cancer Mother    Pancreatic cancer Father    Cancer Father    Colon cancer Neg Hx    Colon polyps Neg Hx    Esophageal cancer Neg Hx    Rectal cancer Neg Hx    Stomach cancer Neg Hx     Allergies  Allergen Reactions   Clarithromycin Other (See Comments)   Penicillins     Current Outpatient Medications on File Prior to Visit  Medication Sig Dispense Refill   aspirin EC (ASPIR-LOW) 81 MG tablet 1 tablet     Biotin  89999 MCG TABS 1 tablet     cholecalciferol (VITAMIN D3) 25 MCG (1000 UNIT) tablet 1 tablet     cyanocobalamin (VITAMIN B12) 100 MCG tablet See admin instructions.     fluorouracil (EFUDEX) 5 % cream Apply topically 2 (two) times daily.     Krill Oil 500 MG CAPS Take by mouth.     rosuvastatin  (CRESTOR ) 10 MG tablet TAKE 1 TABLET AT BEDTIME 90 tablet 1   No current facility-administered medications on file prior to visit.    BP 110/70   Pulse 86   Temp 98.7 F (37.1 C) (Oral)   Ht 5' 11 (1.803 m)   Wt 242 lb (109.8 kg)   SpO2 99%   BMI 33.75 kg/m       Objective:   Physical Exam Vitals and nursing note reviewed.  Constitutional:      Appearance: Normal appearance.  HENT:     Right Ear: Hearing, tympanic membrane, ear canal and external ear normal.     Left Ear: Hearing, tympanic membrane, ear canal and external ear normal.     Nose: Congestion and rhinorrhea present. Rhinorrhea is purulent.     Right Turbinates: Enlarged.     Left Turbinates: Enlarged.     Right Sinus: Maxillary sinus tenderness and frontal sinus tenderness present.     Left Sinus: Maxillary sinus tenderness and frontal sinus tenderness present.     Mouth/Throat:     Pharynx: Postnasal drip present.  Cardiovascular:     Rate and Rhythm: Normal rate and regular rhythm.     Pulses: Normal pulses.     Heart sounds: Normal heart sounds.  Pulmonary:     Effort: Pulmonary effort is normal.     Breath sounds: Normal breath sounds.  Musculoskeletal:        General: Normal range of motion.  Skin:    General: Skin is warm and dry.  Neurological:     General: No focal deficit present.     Mental Status: He is alert and oriented to person, place, and time.  Psychiatric:        Mood and Affect: Mood normal.        Behavior: Behavior normal.        Thought Content: Thought content normal.        Judgment: Judgment normal.           Assessment & Plan:   Assessment and Plan    Acute pansinusitis  with  persistent cough Likely viral infection progressing to bacterial sinusitis. Allergic to penicillin and clarithromycin. - Prescribed doxycycline  100 mg twice daily for 7 days. - Recommended Delsym for cough suppression.

## 2024-10-29 NOTE — Telephone Encounter (Signed)
 FYI Only or Action Required?: FYI only for provider: appointment scheduled on this morning.  Patient was last seen in primary care on 10/25/2024 by Victor Clotilda SAUNDERS, MD.  Called Nurse Triage reporting Cough. With green phlegm  Symptoms began over 10 days.  Interventions attempted: Rest, hydration, or home remedies.  Symptoms are: gradually worsening.  Triage Disposition: See PCP When Office is Open (Within 3 Days)  Patient/caregiver understands and will follow disposition?: Yes                   Copied from CRM #[id number]. Topic: Clinical - Red Word Triage >> Oct 29, 2024  9:35 AM Dedra NOVAK wrote: Red Word that prompted transfer to Nurse Triage: Pt was seen in office last week but wasn't [prescribed anything. His symptoms of congestion and cough have worsened. He is coughing up yellow phlegm. Warm transfer to triage. Reason for Disposition  Cough has been present for > 3 weeks  Answer Assessment - Initial Assessment Questions 1. ONSET: When did the cough begin?      1.5 week 2. SEVERITY: How bad is the cough today?      Moderate 3. SPUTUM: Describe the color of your sputum (e.g., none, dry cough; clear, white, yellow, green)     Yellow or dark yellow 4. HEMOPTYSIS: Are you coughing up any blood? If Yes, ask: How much? (e.g., flecks, streaks, tablespoons, etc.)     no 5. DIFFICULTY BREATHING: Are you having difficulty breathing? If Yes, ask: How bad is it? (e.g., mild, moderate, severe)      no 6. FEVER: Do you have a fever? If Yes, ask: What is your temperature, how was it measured, and when did it start?     no 7. CARDIAC HISTORY: Do you have any history of heart disease? (e.g., heart attack, congestive heart failure)      no 8. LUNG HISTORY: Do you have any history of lung disease?  (e.g., pulmonary embolus, asthma, emphysema)     no  10. OTHER SYMPTOMS: Do you have any other symptoms? (e.g., runny nose, wheezing, chest pain)       Sinus  drainage and congestion. Sore throat has resolved  Protocols used: Cough - Acute Productive-A-AH

## 2025-02-18 ENCOUNTER — Encounter: Admitting: Adult Health
